# Patient Record
Sex: Female | Born: 1985 | Hispanic: Yes | Marital: Married | Smoking: Never smoker
Health system: Southern US, Community
[De-identification: ages and names within clinical notes are randomized; demographics above are authoritative.]

## PROBLEM LIST (undated history)

## (undated) DIAGNOSIS — O00109 Unspecified tubal pregnancy without intrauterine pregnancy: Secondary | ICD-10-CM

## (undated) DIAGNOSIS — T7840XA Allergy, unspecified, initial encounter: Secondary | ICD-10-CM

## (undated) DIAGNOSIS — N63 Unspecified lump in unspecified breast: Secondary | ICD-10-CM

## (undated) DIAGNOSIS — O039 Complete or unspecified spontaneous abortion without complication: Secondary | ICD-10-CM

## (undated) DIAGNOSIS — D649 Anemia, unspecified: Secondary | ICD-10-CM

## (undated) DIAGNOSIS — Z8759 Personal history of other complications of pregnancy, childbirth and the puerperium: Secondary | ICD-10-CM

## (undated) HISTORY — DX: Personal history of other complications of pregnancy, childbirth and the puerperium: Z87.59

## (undated) HISTORY — DX: Unspecified tubal pregnancy without intrauterine pregnancy: O00.109

## (undated) HISTORY — DX: Allergy, unspecified, initial encounter: T78.40XA

---

## 2013-04-14 ENCOUNTER — Emergency Department: Payer: Self-pay | Admitting: Emergency Medicine

## 2013-04-14 LAB — CBC
HCT: 36.5 % (ref 35.0–47.0)
HGB: 12.4 g/dL (ref 12.0–16.0)
MCH: 31.2 pg (ref 26.0–34.0)
MCHC: 34.1 g/dL (ref 32.0–36.0)
MCV: 92 fL (ref 80–100)
Platelet: 218 10*3/uL (ref 150–440)
RBC: 3.98 10*6/uL (ref 3.80–5.20)
RDW: 12.8 % (ref 11.5–14.5)
WBC: 8 10*3/uL (ref 3.6–11.0)

## 2013-04-14 LAB — URINALYSIS, COMPLETE
BLOOD: NEGATIVE
Bacteria: NONE SEEN
Bilirubin,UR: NEGATIVE
Glucose,UR: NEGATIVE mg/dL (ref 0–75)
Ketone: NEGATIVE
LEUKOCYTE ESTERASE: NEGATIVE
NITRITE: NEGATIVE
PH: 7 (ref 4.5–8.0)
Protein: NEGATIVE
RBC,UR: 1 /HPF (ref 0–5)
Specific Gravity: 1.015 (ref 1.003–1.030)
WBC UR: 1 /HPF (ref 0–5)

## 2013-04-14 LAB — COMPREHENSIVE METABOLIC PANEL
AST: 17 U/L (ref 15–37)
Albumin: 3.5 g/dL (ref 3.4–5.0)
Alkaline Phosphatase: 58 U/L
Anion Gap: 5 — ABNORMAL LOW (ref 7–16)
BUN: 11 mg/dL (ref 7–18)
Bilirubin,Total: 0.2 mg/dL (ref 0.2–1.0)
Calcium, Total: 8.9 mg/dL (ref 8.5–10.1)
Chloride: 105 mmol/L (ref 98–107)
Co2: 26 mmol/L (ref 21–32)
Creatinine: 0.74 mg/dL (ref 0.60–1.30)
EGFR (African American): >60
EGFR (Non-African Amer.): >60
Glucose: 77 mg/dL (ref 65–99)
OSMOLALITY: 270 (ref 275–301)
Potassium: 4 mmol/L (ref 3.5–5.1)
SGPT (ALT): 22 U/L (ref 12–78)
SODIUM: 136 mmol/L (ref 136–145)
Total Protein: 7.3 g/dL (ref 6.4–8.2)

## 2013-04-15 LAB — WET PREP, GENITAL

## 2013-04-15 LAB — GC/CHLAMYDIA PROBE AMP

## 2013-04-15 LAB — HCG, QUANTITATIVE, PREGNANCY: BETA HCG, QUANT.: 27415 m[IU]/mL — AB

## 2013-07-11 ENCOUNTER — Emergency Department: Payer: Self-pay | Admitting: Emergency Medicine

## 2013-07-11 LAB — BASIC METABOLIC PANEL
ANION GAP: 5 — AB (ref 7–16)
BUN: 9 mg/dL (ref 7–18)
Calcium, Total: 9.3 mg/dL (ref 8.5–10.1)
Chloride: 107 mmol/L (ref 98–107)
Co2: 28 mmol/L (ref 21–32)
Creatinine: 0.76 mg/dL (ref 0.60–1.30)
EGFR (Non-African Amer.): >60
Glucose: 102 mg/dL — ABNORMAL HIGH (ref 65–99)
Osmolality: 278 (ref 275–301)
Potassium: 3.9 mmol/L (ref 3.5–5.1)
Sodium: 140 mmol/L (ref 136–145)

## 2015-01-05 ENCOUNTER — Emergency Department
Admission: EM | Admit: 2015-01-05 | Discharge: 2015-01-05 | Disposition: A | Discharge status: Home / Self Care | Payer: Self-pay | Attending: Emergency Medicine | Admitting: Emergency Medicine

## 2015-01-05 DIAGNOSIS — N3289 Other specified disorders of bladder: Secondary | ICD-10-CM | POA: Insufficient documentation

## 2015-01-05 DIAGNOSIS — Z3202 Encounter for pregnancy test, result negative: Secondary | ICD-10-CM | POA: Insufficient documentation

## 2015-01-05 HISTORY — DX: Anemia, unspecified: D64.9

## 2015-01-05 LAB — URINALYSIS COMPLETE WITH MICROSCOPIC (ARMC ONLY)
BILIRUBIN URINE: NEGATIVE
Glucose, UA: NEGATIVE mg/dL
Ketones, ur: NEGATIVE mg/dL
LEUKOCYTES UA: NEGATIVE
Nitrite: NEGATIVE
PH: 7 (ref 5.0–8.0)
Protein, ur: NEGATIVE mg/dL
Specific Gravity, Urine: 1.011 (ref 1.005–1.030)

## 2015-01-05 LAB — POCT PREGNANCY, URINE: PREG TEST UR: NEGATIVE

## 2015-01-05 MED ORDER — PHENAZOPYRIDINE HCL 200 MG PO TABS: 200.0000 mg | ORAL_TABLET | Freq: Three times a day (TID) | ORAL | Status: DC | PRN

## 2015-01-05 NOTE — ED Notes (Signed)
Patient complaining of urinary frequency and lower right and left abdominal pain.

## 2015-01-05 NOTE — ED Provider Notes (Signed)
[facility] Emergency Department Provider Note ____________________________________________  Time seen: Approximately 11:28 PM  I have reviewed the triage vital signs and the nursing notes.   HISTORY  Chief Complaint Urinary Frequency   HPI Breanna Shelton is a 29 y.o. female who presents to the emergency department for evaluation of urinary frequency and pelvic pain. She states that this has been present for about a month. She has not been evaluated for this previously. She reports urinary frequency without dysuria or hematuria.She denies vaginal discharge or bleeding.  Past Medical History  Diagnosis Date  . Anemia     There are no active problems to display for this patient.   History reviewed. No pertinent past surgical history.  Current Outpatient Rx  Name  Route  Sig  Dispense  Refill  . phenazopyridine (PYRIDIUM) 200 MG tablet   Oral   Take 1 tablet (200 mg total) by mouth 3 (three) times daily as needed for pain.   20 tablet   0     Allergies Review of patient's allergies indicates no known allergies.  History reviewed. No pertinent family history.  Social History Social History  Substance Use Topics  . Smoking status: Never Smoker   . Smokeless tobacco: None  . Alcohol Use: No    Review of Systems Constitutional: No fever/chills Cardiovascular: Denies chest pain. Respiratory: Denies shortness of breath or cough. Gastrointestinal: Abdominal pain suprapubic tenderness., nausea no, vomitingno. Genitourinary: Dysuria no, vaginal discharge no.. Musculoskeletal: Negative for back pain. Skin: Negative for rash. Neurological: Negative for headaches, focal weakness or numbness.  10-point ROS otherwise negative.  ____________________________________________   PHYSICAL EXAM:  VITAL SIGNS: ED Triage Vitals  Enc Vitals Group     BP 01/05/15 1848 122/77 mmHg     Pulse Rate 01/05/15 1848 64     Resp 01/05/15 1848 16     Temp  01/05/15 1848 97.4 F (36.3 C)     Temp Source 01/05/15 1848 Oral     SpO2 01/05/15 1848 98 %     Weight 01/05/15 1848 143 lb (64.864 kg)     Height 01/05/15 1848 5' (1.524 m)     Head Cir --      Peak Flow --      Pain Score 01/05/15 1848 5     Pain Loc --      Pain Edu? --      Excl. in GC? --     Constitutional: Alert and oriented. Well appearing and in no acute distress. Eyes: Conjunctivae are normal. PERRL. EOMI. Head: Atraumatic. Nose: No congestion/rhinnorhea. Mouth/Throat: Mucous membranes are moist.  Oropharynx non-erythematous. Neck: No stridor. Cardiovascular: Good peripheral circulation. Respiratory: Normal respiratory effort.  No retractions. Gastrointestinal: Suprapubic tenderness noted on exam. No rebound tenderness, no distention, bowel sounds present 4 quadrants  Genitourinary: Pelvic exam: Deferred Musculoskeletal: No extremity tenderness nor edema.  Neurologic:  Normal speech and language. No gross focal neurologic deficits are appreciated. Speech is normal. No gait instability. Skin:  Skin is warm, dry and intact. No rash noted. Psychiatric: Mood and affect are normal. Speech and behavior are normal.  ____________________________________________   LABS (all labs ordered are listed, but only abnormal results are displayed)  Labs Reviewed  URINALYSIS COMPLETEWITH MICROSCOPIC ([facility] ONLY) - Abnormal; Notable for the following:    Color, Urine STRAW (*)    APPearance CLEAR (*)    Hgb urine dipstick 2+ (*)    Bacteria, UA RARE (*)    Squamous Epithelial / LPF  6-30 (*)    All other components within normal limits  POC URINE PREG, ED  POCT PREGNANCY, URINE   ____________________________________________  RADIOLOGY   ____________________________________________   PROCEDURES  Procedure(s) performed: None  ____________________________________________   INITIAL IMPRESSION / ASSESSMENT AND PLAN / ED COURSE  Pertinent labs & imaging results that  were available during my care of the patient were reviewed by me and considered in my medical decision making (see chart for details).  Patient was instructed to follow up with urology.  She was advised to return to the emergency department for symptoms that change or worsen if she's unable to schedule an appointment. ____________________________________________   FINAL CLINICAL IMPRESSION(S) / ED DIAGNOSES  Final diagnoses:  Bladder spasms       Chinita Pester, FNP 01/05/15 2338  Myrna Blazer, MD 01/05/15 [phone]

## 2015-01-17 ENCOUNTER — Ambulatory Visit (INDEPENDENT_AMBULATORY_CARE_PROVIDER_SITE_OTHER): Payer: Self-pay | Admitting: Urology

## 2015-01-17 ENCOUNTER — Encounter: Payer: Self-pay | Admitting: Urology

## 2015-01-17 VITALS — BP 123/86 | HR 73 | Ht 65.0 in | Wt 142.3 lb

## 2015-01-17 DIAGNOSIS — R350 Frequency of micturition: Secondary | ICD-10-CM

## 2015-01-17 DIAGNOSIS — N393 Stress incontinence (female) (male): Secondary | ICD-10-CM

## 2015-01-17 DIAGNOSIS — R3129 Other microscopic hematuria: Secondary | ICD-10-CM

## 2015-01-17 DIAGNOSIS — R351 Nocturia: Secondary | ICD-10-CM

## 2015-01-17 LAB — BLADDER SCAN AMB NON-IMAGING: SCAN RESULT: 20

## 2015-01-17 LAB — URINALYSIS, COMPLETE
BILIRUBIN UA: NEGATIVE
Glucose, UA: NEGATIVE
KETONES UA: NEGATIVE
LEUKOCYTES UA: NEGATIVE
Nitrite, UA: NEGATIVE
PH UA: 5.5 (ref 5.0–7.5)
PROTEIN UA: NEGATIVE
UUROB: 0.2 mg/dL (ref 0.2–1.0)

## 2015-01-17 LAB — MICROSCOPIC EXAMINATION: WBC, UA: NONE SEEN /hpf (ref 0–?)

## 2015-01-17 NOTE — Progress Notes (Signed)
01/17/2015 1:00 PM   Jacolyn ReedyLessy Katina DungVaquedano 09-26-1985 [id number]  Referring provider: No referring provider defined for this encounter.  Chief Complaint  Patient presents with  . Nocturia  . Urinary Frequency    HPI: Patient is a 29 year old Hispanic female who presents today with the interpreter, Kandis CockingMaritza, for urinary frequency, urgency and nocturia after being [facility] ED for these symptoms.  Her UA at the time of her ED visit was unremarkable.  No imaging studies or cultures were performed.  She states 7 months ago, she started having urinary frequency, urgency and nocturia 4 x 5.  She is not experiencing dysuria or gross hematuria.    She was also experiencing dyspareunia and was evaluated by a gynecology.  She could not remember the name of the gynecologist, but she states it was located near the hospital.  She had a pelvic exam, PAP smear and a vaginal ultrasound.  The results were normal.    She has had constipation for the last 4 months and was seen by a PCP and placed on Miralax.    She is voiding 4 to 5 times during the day and nocturia x 4-5.  She had the recent onset of SUI one month ago.  She does not have a history of recurrent UTI's or stones.  There is not a family history of stones.    Her UA noted 3-10 RBC's/hpf on today's exam and her PVR was 20 mL.    PMH: Past Medical History  Diagnosis Date  . Anemia     Surgical History: No past surgical history on file.  Home Medications:    Medication List    Notice  As of 01/17/2015 11:59 PM   You have not been prescribed any medications.      Allergies: No Known Allergies  Family History: Family History  Problem Relation Age of Onset  . Urolithiasis Paternal Grandmother   . Cancer Paternal Grandmother     uterine cancer  . Kidney cancer Neg Hx   . Kidney disease Neg Hx   . Prostate cancer Neg Hx     Social History:  reports that she has never smoked. She does not have any smokeless tobacco history  on file. She reports that she does not drink alcohol. Her drug history is not on file.  ROS: UROLOGY Frequent Urination?: Yes Hard to postpone urination?: No Burning/pain with urination?: No Get up at night to urinate?: Yes Leakage of urine?: Yes Urine stream starts and stops?: No Trouble starting stream?: No Do you have to strain to urinate?: No Blood in urine?: No Urinary tract infection?: No Sexually transmitted disease?: No Injury to kidneys or bladder?: No Painful intercourse?: Yes Weak stream?: No Currently pregnant?: No Vaginal bleeding?: No Last menstrual period?: 01/10/2015  Gastrointestinal Nausea?: No Vomiting?: No Indigestion/heartburn?: No Diarrhea?: No Constipation?: No  Constitutional Fever: No Night sweats?: No Weight loss?: No Fatigue?: No  Skin Skin rash/lesions?: No Itching?: No  Eyes Blurred vision?: No Double vision?: No  Ears/Nose/Throat Sore throat?: No Sinus problems?: No  Hematologic/Lymphatic Swollen glands?: No Easy bruising?: No  Cardiovascular Leg swelling?: No Chest pain?: No  Respiratory Cough?: No Shortness of breath?: No  Endocrine Excessive thirst?: No  Musculoskeletal Back pain?: No Joint pain?: No  Neurological Headaches?: Yes Dizziness?: No  Psychologic Depression?: No Anxiety?: No  Physical Exam: BP 123/86 mmHg  Pulse 73  Ht 5\' 5"  (1.651 m)  Wt 142 lb 4.8 oz (64.547 kg)  BMI 23.68  kg/m2  LMP 11/29/2014  Constitutional: Well nourished. Alert and oriented, No acute distress. HEENT: [place] AT, moist mucus membranes. Trachea midline, no masses. Cardiovascular: No clubbing, cyanosis, or edema. Respiratory: Normal respiratory effort, no increased work of breathing. GI: Abdomen is soft, non tender, non distended, no abdominal masses. Liver and spleen not palpable.  No hernias appreciated.  Stool sample for occult testing is not indicated.   GU: No CVA tenderness.  No bladder fullness or masses.  Normal  external female genitalia. Urethral meatus is patent. Urethra is without masses, tenderness or scarring.  It is hypermobile.  Bladder is non tender.  Grade I cystocele is noted.  No cervical motion tenderness is noted.  The uterus is freely mobile and non fixed.  No masses in the adnexa or parametria.  Anus and perineum are normal.   Skin: No rashes, bruises or suspicious lesions. Lymph: No cervical or inguinal adenopathy. Neurologic: Grossly intact, no focal deficits, moving all 4 extremities. Psychiatric: Normal mood and affect.  Laboratory Data:  Urinalysis Results for orders placed or performed in visit on 01/17/15  Microscopic Examination  Result Value Ref Range   WBC, UA None seen 0 -  5 /hpf   RBC, UA 3-10 (A) 0 -  2 /hpf   Epithelial Cells (non renal) 0-10 0 - 10 /hpf   Mucus, UA Present (A) Not Estab.   Bacteria, UA Few (A) None seen/Few  Urinalysis, Complete  Result Value Ref Range   Specific Gravity, UA >1.030 (H) 1.005 - 1.030   pH, UA 5.5 5.0 - 7.5   Color, UA Yellow Yellow   Appearance Ur Clear Clear   Leukocytes, UA Negative Negative   Protein, UA Negative Negative/Trace   Glucose, UA Negative Negative   Ketones, UA Negative Negative   RBC, UA Trace (A) Negative   Bilirubin, UA Negative Negative   Urobilinogen, Ur 0.2 0.2 - 1.0 mg/dL   Nitrite, UA Negative Negative   Microscopic Examination See below:   BUN+Creat  Result Value Ref Range   BUN 9 6 - 20 mg/dL   Creatinine, Ser 1.61 0.57 - 1.00 mg/dL   GFR calc non Af Amer 125 >59 mL/min/1.73   GFR calc Af Amer 144 >59 mL/min/1.73   BUN/Creatinine Ratio 15 8 - 20  hCG, serum, qualitative  Result Value Ref Range   hCG,Beta Subunit,Qual,Serum Negative Negative <6 mIU/mL  BLADDER SCAN AMB NON-IMAGING  Result Value Ref Range   Scan Result 20     Pertinent Imaging: Results for LAURINDA, CARRENO (MRN [id number]) as of 01/18/2015 12:33  Ref. Range 01/17/2015 14:23  Scan Result Unknown 20    Assessment & Plan:      1. Microscopic hematuria:   Explained to patient the causes of blood in the urine are as follows: stones, BPH, UTI's, damage to the urinary tract and/or cancer.  It is explained to the patient that they will be scheduled for a CT Urogram with contrast material and that in rare instances, an allergic reaction can be serious and even life threatening with the injection of contrast material.   The patient denies any allergies to contrast, iodine and/or seafood and is not taking metformin.  2. Urinary frequency:   Patient's UA demonstrated microscopic hematuria and her PVR was minimal.  Her frequency may be the result of a ureteral stone.  She will be undergoing a CT Urogram for further evaluation.    - CULTURE, URINE COMPREHENSIVE - Urinalysis, Complete - BLADDER SCAN AMB NON-IMAGING  3. Nocturia:   See above.  4. SUI:   We will address once the hematuria work up is completed.     Return for CT Urogram report.  Michiel Cowboy, PA-C  [facility] [address], [address] [place], [place] [address] [phone]

## 2015-01-18 DIAGNOSIS — N393 Stress incontinence (female) (male): Secondary | ICD-10-CM | POA: Insufficient documentation

## 2015-01-18 DIAGNOSIS — R350 Frequency of micturition: Secondary | ICD-10-CM | POA: Insufficient documentation

## 2015-01-18 DIAGNOSIS — R351 Nocturia: Secondary | ICD-10-CM | POA: Insufficient documentation

## 2015-01-18 DIAGNOSIS — R3129 Other microscopic hematuria: Secondary | ICD-10-CM | POA: Insufficient documentation

## 2015-01-18 LAB — BUN+CREAT
BUN/Creatinine Ratio: 15 (ref 8–20)
BUN: 9 mg/dL (ref 6–20)
CREATININE: 0.6 mg/dL (ref 0.57–1.00)
GFR, EST AFRICAN AMERICAN: 144 mL/min/{1.73_m2} (ref >59)
GFR, EST NON AFRICAN AMERICAN: 125 mL/min/{1.73_m2} (ref >59)

## 2015-01-18 LAB — HCG, SERUM, QUALITATIVE: hCG,Beta Subunit,Qual,Serum: NEGATIVE m[IU]/mL (ref <6)

## 2015-01-21 LAB — CULTURE, URINE COMPREHENSIVE

## 2015-01-25 ENCOUNTER — Ambulatory Visit
Admission: RE | Admit: 2015-01-25 | Discharge: 2015-01-25 | Disposition: A | Discharge status: Home / Self Care | Payer: Self-pay | Source: Ambulatory Visit | Attending: Urology | Admitting: Urology

## 2015-01-25 DIAGNOSIS — R3129 Other microscopic hematuria: Secondary | ICD-10-CM

## 2015-01-25 MED ORDER — IOHEXOL 350 MG/ML SOLN
125.0000 mL | Freq: Once | INTRAVENOUS | Status: AC | PRN
  Administered 2015-01-25: 125 mL via INTRAVENOUS

## 2015-01-31 ENCOUNTER — Encounter: Payer: Self-pay | Admitting: Urology

## 2015-01-31 ENCOUNTER — Ambulatory Visit: Payer: Self-pay | Admitting: Urology

## 2015-01-31 LAB — URINALYSIS, COMPLETE
BILIRUBIN UA: NEGATIVE
GLUCOSE, UA: NEGATIVE
KETONES UA: NEGATIVE
LEUKOCYTES UA: NEGATIVE
Nitrite, UA: NEGATIVE
PROTEIN UA: NEGATIVE
SPEC GRAV UA: 1.02 (ref 1.005–1.030)
Urobilinogen, Ur: 0.2 mg/dL (ref 0.2–1.0)
pH, UA: 6.5 (ref 5.0–7.5)

## 2015-01-31 LAB — MICROSCOPIC EXAMINATION
Bacteria, UA: NONE SEEN
EPITHELIAL CELLS (NON RENAL): NONE SEEN /HPF (ref 0–10)

## 2015-01-31 MED ORDER — CIPROFLOXACIN HCL 500 MG PO TABS: 500.0000 mg | ORAL_TABLET | Freq: Once | ORAL | Status: DC

## 2015-01-31 MED ORDER — LIDOCAINE HCL 2 % EX GEL: 1.0000 "application " | Freq: Once | CUTANEOUS | Status: DC

## 2015-01-31 NOTE — Progress Notes (Signed)
The pt No Show today for her Cysto visit.

## 2015-01-31 NOTE — Progress Notes (Signed)
This encounter was created in error - please disregard.

## 2015-02-10 ENCOUNTER — Encounter: Payer: Self-pay | Admitting: Urology

## 2015-02-10 ENCOUNTER — Ambulatory Visit (INDEPENDENT_AMBULATORY_CARE_PROVIDER_SITE_OTHER): Payer: Self-pay | Admitting: Urology

## 2015-02-10 VITALS — BP 115/79 | HR 74 | Ht 62.0 in | Wt 146.6 lb

## 2015-02-10 DIAGNOSIS — R3129 Other microscopic hematuria: Secondary | ICD-10-CM

## 2015-02-10 LAB — URINALYSIS, COMPLETE
Bilirubin, UA: NEGATIVE
GLUCOSE, UA: NEGATIVE
KETONES UA: NEGATIVE
Nitrite, UA: NEGATIVE
PROTEIN UA: NEGATIVE
SPEC GRAV UA: 1.02 (ref 1.005–1.030)
Urobilinogen, Ur: 0.2 mg/dL (ref 0.2–1.0)
pH, UA: 6 (ref 5.0–7.5)

## 2015-02-10 LAB — MICROSCOPIC EXAMINATION: Epithelial Cells (non renal): >10 /hpf — ABNORMAL HIGH (ref 0–10)

## 2015-02-10 MED ORDER — CIPROFLOXACIN HCL 500 MG PO TABS
500.0000 mg | ORAL_TABLET | Freq: Once | ORAL | Status: AC
  Administered 2015-02-10: 500 mg via ORAL

## 2015-02-10 MED ORDER — LIDOCAINE HCL 2 % EX GEL
1.0000 "application " | Freq: Once | CUTANEOUS | Status: AC
  Administered 2015-02-10: 1 via URETHRAL

## 2015-02-10 NOTE — Progress Notes (Signed)
   HPI: [name] is a 29yo with a hx of microhematuria here for cystoscopy for her hematuria workup. CT abd/pelvis w/wo contrast did not show any GU abnormalities Cystoscopy Procedure Note  Patient identification was confirmed, informed consent was obtained, and patient was prepped using Betadine solution.  Lidocaine jelly was administered per urethral meatus.    Preoperative abx where received prior to procedure.    Procedure: - Flexible cystoscope introduced, without any difficulty.   - Thorough search of the bladder revealed:    normal urethral meatus    normal urothelium    no stones    no ulcers     no tumors    no urethral polyps    no trabeculation  - Ureteral orifices were normal in position and appearance.  Post-Procedure: - Patient tolerated the procedure well  Assessment: Microhematuria  Plan: 1. RTC 3 months with UA

## 2015-03-30 ENCOUNTER — Emergency Department
Admission: EM | Admit: 2015-03-30 | Discharge: 2015-03-30 | Disposition: A | Discharge status: Home / Self Care | Payer: Self-pay | Attending: Emergency Medicine | Admitting: Emergency Medicine

## 2015-03-30 ENCOUNTER — Encounter: Payer: Self-pay | Admitting: *Deleted

## 2015-03-30 DIAGNOSIS — R102 Pelvic and perineal pain: Secondary | ICD-10-CM

## 2015-03-30 DIAGNOSIS — B9689 Other specified bacterial agents as the cause of diseases classified elsewhere: Secondary | ICD-10-CM

## 2015-03-30 DIAGNOSIS — N760 Acute vaginitis: Secondary | ICD-10-CM | POA: Insufficient documentation

## 2015-03-30 DIAGNOSIS — Z3202 Encounter for pregnancy test, result negative: Secondary | ICD-10-CM | POA: Insufficient documentation

## 2015-03-30 DIAGNOSIS — N926 Irregular menstruation, unspecified: Secondary | ICD-10-CM | POA: Insufficient documentation

## 2015-03-30 LAB — WET PREP, GENITAL
Sperm: NONE SEEN
Trich, Wet Prep: NONE SEEN
Yeast Wet Prep HPF POC: NONE SEEN

## 2015-03-30 LAB — URINALYSIS COMPLETE WITH MICROSCOPIC (ARMC ONLY)
Bilirubin Urine: NEGATIVE
Glucose, UA: NEGATIVE mg/dL
Hgb urine dipstick: NEGATIVE
KETONES UR: NEGATIVE mg/dL
Leukocytes, UA: NEGATIVE
Nitrite: NEGATIVE
PROTEIN: NEGATIVE mg/dL
Specific Gravity, Urine: 1.016 (ref 1.005–1.030)
pH: 7 (ref 5.0–8.0)

## 2015-03-30 LAB — COMPREHENSIVE METABOLIC PANEL
ALT: 17 U/L (ref 14–54)
AST: 20 U/L (ref 15–41)
Albumin: 4.5 g/dL (ref 3.5–5.0)
Alkaline Phosphatase: 76 U/L (ref 38–126)
Anion gap: 8 (ref 5–15)
BUN: 13 mg/dL (ref 6–20)
CHLORIDE: 103 mmol/L (ref 101–111)
CO2: 25 mmol/L (ref 22–32)
CREATININE: 0.65 mg/dL (ref 0.44–1.00)
Calcium: 9.6 mg/dL (ref 8.9–10.3)
GFR calc Af Amer: >60 mL/min (ref >60)
GFR calc non Af Amer: >60 mL/min (ref >60)
GLUCOSE: 93 mg/dL (ref 65–99)
Potassium: 4.1 mmol/L (ref 3.5–5.1)
SODIUM: 136 mmol/L (ref 135–145)
Total Bilirubin: 0.3 mg/dL (ref 0.3–1.2)
Total Protein: 7.8 g/dL (ref 6.5–8.1)

## 2015-03-30 LAB — CHLAMYDIA/NGC RT PCR (ARMC ONLY)
CHLAMYDIA TR: NOT DETECTED
N GONORRHOEAE: NOT DETECTED

## 2015-03-30 LAB — CBC
HCT: 41.2 % (ref 35.0–47.0)
Hemoglobin: 13.9 g/dL (ref 12.0–16.0)
MCH: 30.6 pg (ref 26.0–34.0)
MCHC: 33.8 g/dL (ref 32.0–36.0)
MCV: 90.7 fL (ref 80.0–100.0)
PLATELETS: 253 10*3/uL (ref 150–440)
RBC: 4.55 MIL/uL (ref 3.80–5.20)
RDW: 12.6 % (ref 11.5–14.5)
WBC: 6.5 10*3/uL (ref 3.6–11.0)

## 2015-03-30 LAB — LIPASE, BLOOD: LIPASE: 24 U/L (ref 11–51)

## 2015-03-30 LAB — POCT PREGNANCY, URINE: Preg Test, Ur: NEGATIVE

## 2015-03-30 MED ORDER — PROMETHAZINE HCL 25 MG PO TABS: 25.0000 mg | ORAL_TABLET | Freq: Four times a day (QID) | ORAL | Status: DC | PRN

## 2015-03-30 MED ORDER — MEDROXYPROGESTERONE ACETATE 10 MG PO TABS: 10.0000 mg | ORAL_TABLET | Freq: Every day | ORAL | Status: DC

## 2015-03-30 MED ORDER — METRONIDAZOLE 500 MG PO TABS: 500.0000 mg | ORAL_TABLET | Freq: Two times a day (BID) | ORAL | Status: DC

## 2015-03-30 NOTE — ED Provider Notes (Signed)
[facility] Emergency Department Provider Note  ____________________________________________  Time seen: 7:15 PM  I have reviewed the triage vital signs and the nursing notes.   HISTORY  Chief Complaint Abdominal Pain  history obtained with Spanish interpreter at bedside   HPI [facility] Katina Dung is a 30 y.o. female who complains of lower abdominal pain for the past week that worsened over the last 2 days. No nausea vomiting diarrhea. She denies any dysuria frequency or urgency. Has noticed some white vaginal discharge. Has very low suspicion of sexual transmitted infection as she is in a monogamous relationship with her husband. She also reports that she has not had a period since her last cycle which was 02/10/2015. She does note that she has used multiple different contraceptives over the past year including Depo-Provera, , New ring, oral contraceptive pills. Most recently she has been receiving a intramuscular hormonal injection in [place] that she was getting once a month. She last had that in November 2016.     Past Medical History  Diagnosis Date  . Anemia      Patient Active Problem List   Diagnosis Date Noted  . Urinary frequency 01/18/2015  . Microscopic hematuria 01/18/2015  . Nocturia 01/18/2015  . SUI (stress urinary incontinence, female) 01/18/2015     No past surgical history on file.   Current Outpatient Rx  Name  Route  Sig  Dispense  Refill  . medroxyPROGESTERone (PROVERA) 10 MG tablet   Oral   Take 1 tablet (10 mg total) by mouth daily.   7 tablet   0   . metroNIDAZOLE (FLAGYL) 500 MG tablet   Oral   Take 1 tablet (500 mg total) by mouth 2 (two) times daily.   14 tablet   0   . promethazine (PHENERGAN) 25 MG tablet   Oral   Take 1 tablet (25 mg total) by mouth every 6 (six) hours as needed for nausea or vomiting.   15 tablet   0      Allergies Review of patient's allergies indicates no known  allergies.   Family History  Problem Relation Age of Onset  . Urolithiasis Paternal Grandmother   . Cancer Paternal Grandmother     uterine cancer  . Kidney cancer Neg Hx   . Kidney disease Neg Hx   . Prostate cancer Neg Hx     Social History Social History  Substance Use Topics  . Smoking status: Never Smoker   . Smokeless tobacco: None  . Alcohol Use: No    Review of Systems  Constitutional:   No fever positive chills. No weight changes Eyes:   No blurry vision or double vision.  ENT:   No sore throat. Cardiovascular:   No chest pain. Respiratory:   No dyspnea or cough. Gastrointestinal:   Positive pelvic pain without vomiting or diarrhea.  No BRBPR or melena. Genitourinary:   Negative for dysuria, urinary retention, bloody urine, or difficulty urinating. Musculoskeletal:   Negative for back pain. No joint swelling or pain. Skin:   Negative for rash. Neurological:   Negative for headaches, focal weakness or numbness. Psychiatric:  No anxiety or depression.   Endocrine:  No hot/cold intolerance, changes in energy, or sleep difficulty.  10-point ROS otherwise negative.  ____________________________________________   PHYSICAL EXAM:  VITAL SIGNS: ED Triage Vitals  Enc Vitals Group     BP 03/30/15 1815 115/69 mmHg     Pulse Rate 03/30/15 1815 60     Resp 03/30/15  1815 18     Temp 03/30/15 1815 98.6 F (37 C)     Temp Source 03/30/15 1815 Oral     SpO2 03/30/15 1815 98 %     Weight 03/30/15 1815 145 lb (65.772 kg)     Height 03/30/15 1815  (1.549 m)     Head Cir --      Peak Flow --      Pain Score 03/30/15 1816 6     Pain Loc --      Pain Edu? --      Excl. in GC? --     Vital signs reviewed, nursing assessments reviewed.   Constitutional:   Alert and oriented. Well appearing and in no distress. Eyes:   No scleral icterus. No conjunctival pallor. PERRL. EOMI ENT   Head:   Normocephalic and atraumatic.   Nose:   No congestion/rhinnorhea.  No septal hematoma   Mouth/Throat:   MMM, no pharyngeal erythema. No peritonsillar mass. No uvula shift.   Neck:   No stridor. No SubQ emphysema. No meningismus. Hematological/Lymphatic/Immunilogical:   No cervical lymphadenopathy. Cardiovascular:   RRR. Normal and symmetric distal pulses are present in all extremities. No murmurs, rubs, or gallops. Respiratory:   Normal respiratory effort without tachypnea nor retractions. Breath sounds are clear and equal bilaterally. No wheezes/rales/rhonchi. Gastrointestinal:   Soft and nontender. No distention. There is no CVA tenderness.  No rebound, rigidity, or guarding. Genitourinary:   Examination performed with the nurse. External exam normal. Speculum exam reveals take chunky white discharge in the vault. No bleeding. No lesions or areas of inflammation. Bimanual exam reveals some diffuse tenderness. No cervical motion tenderness. No adnexal tenderness or masses. Uterus is nontender on bimanual exam. Musculoskeletal:   Nontender with normal range of motion in all extremities. No joint effusions.  No lower extremity tenderness.  No edema. Neurologic:   Normal speech and language.  CN 2-10 normal. Motor grossly intact. No pronator drift.  Normal gait. No gross focal neurologic deficits are appreciated.  Skin:    Skin is warm, dry and intact. No rash noted.  No petechiae, purpura, or bullae. Psychiatric:   Mood and affect are normal. Speech and behavior are normal. Patient exhibits appropriate insight and judgment.  ____________________________________________    LABS (pertinent positives/negatives) (all labs ordered are listed, but only abnormal results are displayed) Labs Reviewed  WET PREP, GENITAL - Abnormal; Notable for the following:    Clue Cells Wet Prep HPF POC PRESENT (*)    WBC, Wet Prep HPF POC FEW (*)    All other components within normal limits  URINALYSIS COMPLETEWITH MICROSCOPIC ([facility] ONLY) - Abnormal; Notable for the  following:    Color, Urine YELLOW (*)    APPearance CLEAR (*)    Bacteria, UA RARE (*)    Squamous Epithelial / LPF 0-5 (*)    All other components within normal limits  CHLAMYDIA/NGC RT PCR ([facility] ONLY)  LIPASE, BLOOD  COMPREHENSIVE METABOLIC PANEL  CBC  POC URINE PREG, ED  POCT PREGNANCY, URINE   ____________________________________________   EKG    ____________________________________________    RADIOLOGY    ____________________________________________   PROCEDURES   ____________________________________________   INITIAL IMPRESSION / ASSESSMENT AND PLAN / ED COURSE  Pertinent labs & imaging results that were available during my care of the patient were reviewed by me and considered in my medical decision making (see chart for details).  Patient presents with pelvic pain and discharge and a missed period. There  appear to be 2 unrelated issues. The first is that she is having pelvic pain with discharge which appears to be bacterial vaginosis. Very low suspicion for STI PID TOA. Low suspicion for cyst or torsion. We'll treat with Flagyl and Phenergan as needed for associated nausea.  The second issue is her missed period. Pregnancy test is negative and she has no other symptoms to suggest early pregnancy such as morning sickness or breast tenderness or enlargement. She has used multiple hormonal contraceptives and I wonder if this is disrupted her hormonal regulation and caused a degree of secondary amenorrhea. Give her a course of Provera to attempt to restart this. She also notes that she intends to start using a NuvaRing which should also help regulate her cycles. If she has persistent problems with this she can follow up with obstetrics. I have a low suspicion of pregnancy or ectopic at this point.     ____________________________________________   FINAL CLINICAL IMPRESSION(S) / ED DIAGNOSES  Final diagnoses:  Pelvic pain in female  Missed period   Bacterial vaginosis      Sharman Cheek, MD 03/30/15 2045

## 2015-03-30 NOTE — Discharge Instructions (Signed)
Dolor abdominal en adultos (Abdominal Pain, Adult) El dolor puede tener muchas causas. Normalmente la causa del dolor abdominal no es una enfermedad y Scientist, clinical (histocompatibility and immunogenetics) sin TEFL teacher. Frecuentemente puede controlarse y tratarse en casa. Su mdico le Medical sales representative examen fsico y posiblemente solicite anlisis de sangre y radiografas para ayudar a Chief Strategy Officer la gravedad de su dolor. Sin embargo, en [employer], debe transcurrir ms tiempo antes de que se pueda Clinical research associate una causa evidente del dolor. Antes de llegar a ese punto, es posible que su mdico no sepa si necesita ms pruebas o un tratamiento ms profundo. INSTRUCCIONES PARA EL CUIDADO EN EL HOGAR  Est atento al dolor para ver si hay cambios. Las siguientes indicaciones ayudarn a Architectural technologist que pueda sentir:  [place] solo medicamentos de venta libre o recetados, segn las indicaciones del mdico.  No tome laxantes a menos que se lo haya indicado su mdico.  Pruebe con Neomia Dear dieta lquida absoluta (caldo, t o agua) segn se lo indique su mdico. Introduzca gradualmente una dieta normal, segn su tolerancia. SOLICITE ATENCIN MDICA SI:  Tiene dolor abdominal sin explicacin.  Tiene dolor abdominal relacionado con nuseas o diarrea.  Tiene dolor cuando orina o defeca.  Experimenta dolor abdominal que lo despierta de noche.  Tiene dolor abdominal que empeora o mejora cuando come alimentos.  Tiene dolor abdominal que empeora cuando come alimentos grasosos.  Tiene fiebre. SOLICITE ATENCIN MDICA DE INMEDIATO SI:   El dolor no desaparece en un plazo mximo de 2horas.  No deja de (vomitar).  El Engineer, mining se siente solo en partes del abdomen, como el lado derecho o la parte inferior izquierda del abdomen.  Evaca materia fecal sanguinolenta o negra, de aspecto alquitranado. ASEGRESE DE QUE:  Comprende estas instrucciones.  Controlar su afeccin.  Recibir ayuda de inmediato si no mejora o si empeora.   Esta  informacin no tiene Theme park manager el consejo del mdico. Asegrese de hacerle al mdico cualquier pregunta que tenga.   Document Released: 02/19/2005 Document Revised: 03/12/2014 Elsevier Interactive Patient Education 2016 [employer].  Antibiticos (Antibiotic Medicine) Los antibiticos se utilizan para tratar las infecciones causadas por bacterias. Estos medicamentos daan o matan a los grmenes que producen enfermedades. CMO SE ELEGIR EL MEDICAMENTO? Hay muchos tipos de antibiticos. Para ayudar al mdico a escoger uno, infrmele lo siguiente:  Si tiene [place].  Si est embarazada o planea estarlo.  Si est amamantando.  Si est tomando algn medicamento. Por ejemplo, medicamentos recetados y de [address], as como remedios a base de hierbas.  Si tiene una enfermedad o un problema. Si tiene dudas sobre por qu se eligi el medicamento, pregunte. CUNTO TIEMPO DEBO TOMAR EL MEDICAMENTO? Tome el medicamento durante el tiempo que le indique el mdico. Aunque se sienta mejor, no deje de tomarlo. Si deja de tomarlo demasiado pronto:  Puede empezar a sentirse mal nuevamente.  Puede ser ms difcil tratar la infeccin.  Pueden aparecer nuevos problemas. QU SUCEDE SI OMITO UNA DOSIS? Intente no omitir ninguna dosis del antibitico. Si omite una dosis:  Tmela tan pronto como pueda.  Si est tomando 2dosis diarias, tome la siguiente dosis a las 5 o [address]  Si est tomando 3o ms dosis diarias, tome la siguiente dosis a las 2 o 4horas. Luego tome las dosis en los horarios normales. Si no puede tomar una dosis que se salte, tome la siguiente dosis en el horario correspondiente. A continuacin, tome la dosis que se salte despus de haber tomado todas las  dosis como se lo haya indicado el mdico, como si le faltara tomar una dosis ms. ESTE MEDICAMENTO AFECTA LA ANTICONCEPCIN? Es posible que los anticonceptivos no resulten eficaces mientras se toman  antibiticos. Si est tomando pldoras anticonceptivas, contine tomndolas como siempre. Use un segundo mtodo anticonceptivo, por ejemplo, un preservativo. Siga utilizando el segundo mtodo anticonceptivo hasta terminar el ciclo actual de de pldoras anticonceptivas. SOLICITE AYUDA SI:  Empeora.  No se siente mejor despus de [address] de [address] empezado a Designer, industrial/product.  Vomita.  Le aparecen placas blancas en la boca.  Tiene un dolor articular nuevo despus de empezar a Designer, industrial/product.  Tiene dolores musculares nuevos despus de Corporate investment banker a Designer, industrial/product.  Tuvo fiebre antes de Corporate investment banker a Designer, industrial/product, y este sntoma regresa.  Tiene sntomas de Runner, broadcasting/film/video, como una erupcin cutnea que le produce picazn. Si esto ocurre, deje de Designer, industrial/product. SOLICITE AYUDA DE INMEDIATO SI:  La orina se vuelve oscura o de color sangre.  La piel se vuelve amarillenta.  Le salen hematomas o sangra fcilmente.  Tiene diarrea muy intensa y clicos abdominales.  Siente un dolor de cabeza muy intenso.  Tiene signos de Runner, broadcasting/film/video muy grave, por ejemplo:  Problemas respiratorios.  Sibilancias.  Hinchazn de los labios, la lengua o el rostro.  Desmayos.  Ampollas en la piel o en la boca. Si tiene signos de Runner, broadcasting/film/video muy grave, deje de tomar el antibitico de inmediato.   Esta informacin no tiene Theme park manager el consejo del mdico. Asegrese de hacerle al mdico cualquier pregunta que tenga.   Document Released: 03/24/2010 Document Revised: 11/10/2014 Elsevier Interactive Patient Education 2016 [employer].  Vaginosis bacteriana (Bacterial Vaginosis) La vaginosis bacteriana es una infeccin vaginal que perturba el equilibrio normal de las bacterias que se encuentran en la vagina. Es el resultado de un crecimiento excesivo de ciertas bacterias. Esta es la infeccin vaginal ms frecuente en mujeres en edad  reproductiva. El tratamiento es importante para prevenir complicaciones, especialmente en mujeres embarazadas, dado que puede causar un parto prematuro. CAUSAS  La vaginosis bacteriana se origina por un aumento de bacterias nocivas que, generalmente, estn presentes en cantidades ms pequeas en la vagina. Varios tipos diferentes de bacterias pueden causar esta afeccin. Sin embargo, la causa de su desarrollo no se comprende totalmente. FACTORES DE RIESGO Ciertas actividades o comportamientos pueden exponerlo a un mayor riesgo de desarrollar vaginosis bacteriana, entre los que se incluyen:  Tener una nueva pareja sexual o mltiples parejas sexuales.  Las duchas vaginales  El uso del DIU (dispositivo intrauterino) como mtodo anticonceptivo. El contagio no se produce en baos, por ropas de cama, en piscinas o por contacto con objetos. SIGNOS Y SNTOMAS  Algunas mujeres que padecen vaginosis bacteriana no presentan signos ni sntomas. Los sntomas ms comunes son:  Secrecin vaginal de color grisceo.  Secrecin vaginal con olor similar al [employer], especialmente despus de Sales promotion account executive.  Picazn o sensacin de ardor en la vagina o la vulva.  Ardor o dolor al [employer]. DIAGNSTICO  Su mdico analizar su historia clnica y le examinar la vagina para detectar signos de vaginosis bacteriana. Puede tomarle Lauris Poag de flujo vaginal. Su mdico examinar esta muestra con un microscopio para controlar las bacterias y clulas anormales. Tambin puede realizarse un anlisis del pH vaginal.  TRATAMIENTO  La vaginosis bacteriana puede tratarse con antibiticos, en forma de comprimidos o de crema vaginal. Puede indicarse una segunda tanda  de antibiticos si la afeccin se repite despus del tratamiento. Debido a que la vaginosis bacteriana aumenta el riesgo de contraer enfermedades de transmisin sexual, el tratamiento puede ayudar a reducir el riesgo de clamidia, [place], VIH y  herpes. INSTRUCCIONES PARA EL CUIDADO EN EL HOGAR   Tome solo medicamentos de venta libre o recetados, segn las indicaciones del mdico.  Si le han recetado antibiticos, tmelos como se le indic. Asegrese de que finaliza la prescripcin completa aunque se sienta mejor.  Comunique a sus compaeros sexuales que sufre una infeccin vaginal. Deben consultar a su mdico y recibir tratamiento si tienen problemas, como picazn o una erupcin cutnea leve.  Durante el [place], es importante que siga estas indicaciones:  Fish farm manager relaciones sexuales o use preservativos de la forma correcta.  No se haga duchas vaginales.  Evite consumir alcohol como se lo haya indicado el mdico.  Psychologist, sport and exercise se lo haya indicado el mdico. SOLICITE ATENCIN MDICA SI:   Sus sntomas no mejoran despus de 3 das de [place].  Aumenta la secrecin o Chief Technology Officer.  Tiene fiebre. ASEGRESE DE QUE:   Comprende estas instrucciones.  Controlar su afeccin.  Recibir ayuda de inmediato si no mejora o si empeora. PARA OBTENER MS INFORMACIN  Centros para el control y la prevencin de Child psychotherapist for Disease Control and Prevention, CDC): SolutionApps.co.za Asociacin Estadounidense de la Salud Sexual (American Sexual Health Association, SHA): www.ashastd.org    Esta informacin no tiene Theme park manager el consejo del mdico. Asegrese de hacerle al mdico cualquier pregunta que tenga.   Document Released: 05/29/2007 Document Revised: 03/12/2014 Elsevier Interactive Patient Education [employer]

## 2015-03-30 NOTE — ED Notes (Signed)
Pt has low abd pain for 1 week.  No vag bleeding.  No dysuria.  No back pain.

## 2015-03-30 NOTE — ED Notes (Signed)
Patient complaining of lower abdominal and flank pain and chills for a week, increasing over the past two days. Denies N/V/D, denies pain with urination. States she is having normal bowel movements. A&O x4. Patient in no apparent distress.

## 2015-05-12 ENCOUNTER — Encounter: Payer: Self-pay | Admitting: Urology

## 2015-05-12 ENCOUNTER — Ambulatory Visit (INDEPENDENT_AMBULATORY_CARE_PROVIDER_SITE_OTHER): Payer: Self-pay | Admitting: Urology

## 2015-05-12 VITALS — BP 114/75 | HR 72 | Ht 61.0 in | Wt 146.2 lb

## 2015-05-12 DIAGNOSIS — N3281 Overactive bladder: Secondary | ICD-10-CM

## 2015-05-12 DIAGNOSIS — R3129 Other microscopic hematuria: Secondary | ICD-10-CM

## 2015-05-12 LAB — URINALYSIS, COMPLETE
Bilirubin, UA: NEGATIVE
GLUCOSE, UA: NEGATIVE
Ketones, UA: NEGATIVE
LEUKOCYTES UA: NEGATIVE
Nitrite, UA: NEGATIVE
PROTEIN UA: NEGATIVE
RBC, UA: NEGATIVE
Specific Gravity, UA: >1.03 — ABNORMAL HIGH (ref 1.005–1.030)
UUROB: 0.2 mg/dL (ref 0.2–1.0)
pH, UA: 5.5 (ref 5.0–7.5)

## 2015-05-12 LAB — MICROSCOPIC EXAMINATION

## 2015-05-12 MED ORDER — MIRABEGRON ER 25 MG PO TB24: 25.0000 mg | ORAL_TABLET | Freq: Every day | ORAL | Status: DC

## 2015-05-12 NOTE — Progress Notes (Signed)
05/12/2015 3:35 PM   Breanna Shelton 1985/10/11 [id number]  Referring provider: No referring provider defined for this encounter.  Chief Complaint  Patient presents with  . Follow-up    hematuria    HPI: Patient is a 30 year old Hispanic female who presents today with the interpreter, Kandis Cocking, for urinary frequency, urgency and nocturia after being seen in [facility] ED for these symptoms. Her UA at the time of her ED visit was unremarkable. No imaging studies or cultures were performed.  She states 7 months ago, she started having urinary frequency, urgency and nocturia 4 x 5. She is not experiencing dysuria or gross hematuria.   She was also experiencing dyspareunia and was evaluated by a gynecology. She could not remember the name of the gynecologist, but she states it was located near the hospital. She had a pelvic exam, PAP smear and a vaginal ultrasound. The results were normal.   She has had constipation for the last 4 months and was seen by a PCP and placed on Miralax.   She is voiding 4 to 5 times during the day and nocturia x 4-5. She had the recent onset of SUI one month ago. She does not have a history of recurrent UTI's or stones. There is not a family history of stones.   Her UA noted 3-10 RBC's/hpf on today's exam and her PVR was 20 mL.   PE: Normal external female genitalia. Urethral meatus is patent. Urethra is without masses, tenderness or scarring. It is hypermobile. Bladder is non tender. Grade I cystocele is noted. No cervical motion tenderness is   March 2017 interval history: The patient underwent a hematuria workup which was negative in December 2016. The patient notes continued urinary frequency as well as significant urinary urgency. She also has nocturia 4-5 times per night but she only voids a small amount during each void. She finds is very bothersome to her quality of life. She does note that during which excises she's noticed small  amount of urine leaks but does not find this bothersome.  PMH: Past Medical History  Diagnosis Date  . Anemia     Surgical History: No past surgical history on file.  Home Medications:    Medication List       This list is accurate as of: 05/12/15  3:35 PM.  Always use your most recent med list.               medroxyPROGESTERone 10 MG tablet  Commonly known as:  PROVERA  Take 1 tablet (10 mg total) by mouth daily.        Allergies: No Known Allergies  Family History: Family History  Problem Relation Age of Onset  . Urolithiasis Paternal Grandmother   . Cancer Paternal Grandmother     uterine cancer  . Kidney cancer Neg Hx   . Kidney disease Neg Hx   . Prostate cancer Neg Hx     Social History:  reports that she has never smoked. She does not have any smokeless tobacco history on file. She reports that she does not drink alcohol. Her drug history is not on file.  ROS: UROLOGY Frequent Urination?: Yes Hard to postpone urination?: Yes Burning/pain with urination?: No Get up at night to urinate?: Yes Leakage of urine?: Yes Urine stream starts and stops?: No Trouble starting stream?: No Do you have to strain to urinate?: No Blood in urine?: No Urinary tract infection?: Yes Sexually transmitted disease?: No Injury to kidneys or  bladder?: No Painful intercourse?: Yes Weak stream?: Yes Currently pregnant?: No Vaginal bleeding?: No Last menstrual period?: 04/27/15  Gastrointestinal Nausea?: No Vomiting?: No Indigestion/heartburn?: Yes Diarrhea?: No Constipation?: Yes  Constitutional Fever: No Night sweats?: No Weight loss?: No Fatigue?: No  Skin Skin rash/lesions?: No Itching?: No  Eyes Blurred vision?: Yes Double vision?: No  Ears/Nose/Throat Sore throat?: No Sinus problems?: No  Hematologic/Lymphatic Swollen glands?: No Easy bruising?: Yes  Cardiovascular Leg swelling?: No Chest pain?: No  Respiratory Cough?: No Shortness of  breath?: No  Endocrine Excessive thirst?: No  Musculoskeletal Back pain?: No Joint pain?: No  Neurological Headaches?: Yes Dizziness?: No  Psychologic Depression?: No Anxiety?: No  Physical Exam: BP 114/75 mmHg  Pulse 72  Ht 5\' 1"  (1.549 m)  Wt 146 lb 3.2 oz (66.316 kg)  BMI 27.64 kg/m2  LMP 04/27/2015  Constitutional:  Alert and oriented, No acute distress. HEENT: [place] AT, moist mucus membranes.  Trachea midline, no masses. Cardiovascular: No clubbing, cyanosis, or edema. Respiratory: Normal respiratory effort, no increased work of breathing. GI: Abdomen is soft, nontender, nondistended, no abdominal masses GU: No CVA tenderness.  Skin: No rashes, bruises or suspicious lesions. Lymph: No cervical or inguinal adenopathy. Neurologic: Grossly intact, no focal deficits, moving all 4 extremities. Psychiatric: Normal mood and affect.  Laboratory Data: Lab Results  Component Value Date   WBC 6.5 03/30/2015   HGB 13.9 03/30/2015   HCT 41.2 03/30/2015   MCV 90.7 03/30/2015   PLT 253 03/30/2015    Lab Results  Component Value Date   CREATININE 0.65 03/30/2015    No results found for: PSA  No results found for: TESTOSTERONE  No results found for: HGBA1C  Urinalysis    Component Value Date/Time   COLORURINE YELLOW* 03/30/2015 1821   APPEARANCEUR CLEAR* 03/30/2015 1821   LABSPEC 1.016 03/30/2015 1821   PHURINE 7.0 03/30/2015 1821   GLUCOSEU NEGATIVE 03/30/2015 1821   HGBUR NEGATIVE 03/30/2015 1821   BILIRUBINUR NEGATIVE 03/30/2015 1821   BILIRUBINUR Negative 02/10/2015 0938   KETONESUR NEGATIVE 03/30/2015 1821   PROTEINUR NEGATIVE 03/30/2015 1821   NITRITE NEGATIVE 03/30/2015 1821   NITRITE Negative 02/10/2015 0938   LEUKOCYTESUR NEGATIVE 03/30/2015 1821   LEUKOCYTESUR Trace* 02/10/2015 0938    Assessment & Plan:    1. Microscopic hematuria:  Negative work up in December 2016.  2. Overactive bladder The patient will be tried on mere Myrbetriq 25 mg  daily. 2 is given samples of this. If this medication is too expensive, she will contact our office and we will switch her to an anticholinergic.  Return in about 3 months (around 08/12/2015).  Hildred LaserBrian James Donnis Pecha, MD  [facility] [address], [address] [place], [place] [address] [phone]

## 2015-07-29 ENCOUNTER — Ambulatory Visit: Payer: Self-pay | Admitting: Internal Medicine

## 2015-07-29 ENCOUNTER — Encounter: Payer: Self-pay | Admitting: Internal Medicine

## 2015-07-29 ENCOUNTER — Ambulatory Visit (INDEPENDENT_AMBULATORY_CARE_PROVIDER_SITE_OTHER): Payer: 59 | Admitting: Internal Medicine

## 2015-07-29 VITALS — BP 110/78 | HR 70 | Temp 98.5°F | Resp 16 | Ht 62.0 in | Wt 144.0 lb

## 2015-07-29 DIAGNOSIS — N910 Primary amenorrhea: Secondary | ICD-10-CM | POA: Diagnosis not present

## 2015-07-29 DIAGNOSIS — Z3201 Encounter for pregnancy test, result positive: Secondary | ICD-10-CM | POA: Diagnosis not present

## 2015-07-29 DIAGNOSIS — Z872 Personal history of diseases of the skin and subcutaneous tissue: Secondary | ICD-10-CM | POA: Diagnosis not present

## 2015-07-29 DIAGNOSIS — N926 Irregular menstruation, unspecified: Secondary | ICD-10-CM

## 2015-07-29 LAB — POCT URINE PREGNANCY: PREG TEST UR: POSITIVE — AB

## 2015-07-29 NOTE — Progress Notes (Signed)
Date:  07/29/2015   Name:  Breanna Shelton   DOB:  October 26, 1985   MRN:  [id number]   Chief Complaint: Establish Care; Abdominal Pain; Numbness; and Allergies Abdominal Pain This is a new problem. The current episode started in the past 7 days. The onset quality is sudden. The pain is located in the periumbilical region. The pain is moderate. The quality of the pain is cramping. The abdominal pain does not radiate. Associated symptoms include nausea. Pertinent negatives include no arthralgias, diarrhea, dysuria, fever, headaches or vomiting. She has tried nothing for the symptoms. hx of ectopic pregnancy and she is worried this is another one  Urticaria This is a recurrent problem. The affected locations include the groin, abdomen and torso. The rash is characterized by itchiness. Associated with: triggered by exposure to cold. Pertinent negatives include no diarrhea, fatigue, fever, shortness of breath or vomiting. Past treatments include nothing. (Hx of ectopic pregnancy and she is worried this is another one)  She is one week late on her period and had 2 positive home pregnancy tests.  She has a history of a tubal pregnancy treated with oral medication (?MTX) and did not require surgery.  Her pregnancy hx is 3 normal pregnancies, one ectopic and one pseudocyesis. Numbness - she has intermittent numbness in fingers and hands when she sits or reclines.  She has no pain or weakness.  The symptoms last only a few minutes.    Review of Systems  Constitutional: Negative for fever, chills, fatigue and unexpected weight change.  Respiratory: Negative for chest tightness and shortness of breath.   Cardiovascular: Negative for chest pain, palpitations and leg swelling.  Gastrointestinal: Positive for nausea and abdominal pain. Negative for vomiting and diarrhea.  Genitourinary: Negative for dysuria.  Musculoskeletal: Negative for arthralgias and gait problem.  Skin: Positive for rash.  Neurological:  Positive for numbness. Negative for dizziness, syncope, weakness and headaches.    There are no active problems to display for this patient.   Prior to Admission medications   Not on File    No Known Allergies  History reviewed. No pertinent past surgical history.  Social History  Substance Use Topics  . Smoking status: Never Smoker   . Smokeless tobacco: Never Used  . Alcohol Use: No     Medication list has been reviewed and updated.   Physical Exam  Constitutional: She is oriented to person, place, and time. She appears well-developed. No distress.  HENT:  Head: Normocephalic and atraumatic.  Eyes: Pupils are equal, round, and reactive to light.  Neck: Normal range of motion. Neck supple.  Cardiovascular: Normal rate, regular rhythm and normal heart sounds.   Pulmonary/Chest: Effort normal and breath sounds normal. No respiratory distress.  Abdominal: Soft. There is no hepatosplenomegaly. There is tenderness in the right lower quadrant and left lower quadrant. There is no CVA tenderness.  Musculoskeletal: Normal range of motion.  Tinel's and Phalen's negative bilaterally  Neurological: She is alert and oriented to person, place, and time.  Skin: Skin is warm, dry and intact. No rash noted.  Psychiatric: She has a normal mood and affect. Her behavior is normal. Thought content normal.  Nursing note and vitals reviewed.   BP 110/78 mmHg  Pulse 70  Temp(Src) 98.5 F (36.9 C) (Oral)  Resp 16  Ht 5\' 2"  (1.575 m)  Wt 144 lb (65.318 kg)  BMI 26.33 kg/m2  SpO2 100%  LMP 06/22/2015 (Approximate)  Assessment and Plan: 1. Encounter for pregnancy test,  result positive - Ambulatory referral to Obstetrics / Gynecology  2. History of cold-induced urticaria Discussion regarding cause and prevention No medication while pregnant  3. Menstrual period late - POCT urine pregnancy   Bari Edward, MD [facility]  07/29/2015

## 2015-08-06 ENCOUNTER — Encounter: Payer: Self-pay | Admitting: *Deleted

## 2015-08-06 ENCOUNTER — Emergency Department
Admission: EM | Admit: 2015-08-06 | Discharge: 2015-08-06 | Disposition: A | Discharge status: Home / Self Care | Payer: Self-pay | Attending: Student | Admitting: Student

## 2015-08-06 ENCOUNTER — Emergency Department: Payer: Self-pay

## 2015-08-06 DIAGNOSIS — O26899 Other specified pregnancy related conditions, unspecified trimester: Secondary | ICD-10-CM

## 2015-08-06 DIAGNOSIS — Z3A08 8 weeks gestation of pregnancy: Secondary | ICD-10-CM | POA: Insufficient documentation

## 2015-08-06 DIAGNOSIS — R109 Unspecified abdominal pain: Secondary | ICD-10-CM

## 2015-08-06 DIAGNOSIS — R102 Pelvic and perineal pain: Secondary | ICD-10-CM

## 2015-08-06 DIAGNOSIS — O200 Threatened abortion: Secondary | ICD-10-CM | POA: Insufficient documentation

## 2015-08-06 HISTORY — DX: Complete or unspecified spontaneous abortion without complication: O03.9

## 2015-08-06 LAB — URINALYSIS COMPLETE WITH MICROSCOPIC (ARMC ONLY)
Bacteria, UA: NONE SEEN
Bilirubin Urine: NEGATIVE
Glucose, UA: NEGATIVE mg/dL
Hgb urine dipstick: NEGATIVE
Ketones, ur: NEGATIVE mg/dL
Leukocytes, UA: NEGATIVE
Nitrite: NEGATIVE
Protein, ur: NEGATIVE mg/dL
RBC / HPF: NONE SEEN RBC/hpf (ref 0–5)
Specific Gravity, Urine: 1.001 — ABNORMAL LOW (ref 1.005–1.030)
Squamous Epithelial / HPF: NONE SEEN
WBC, UA: NONE SEEN WBC/hpf (ref 0–5)
pH: 6 (ref 5.0–8.0)

## 2015-08-06 LAB — CBC
HCT: 36.2 % (ref 35.0–47.0)
Hemoglobin: 12.3 g/dL (ref 12.0–16.0)
MCH: 30.1 pg (ref 26.0–34.0)
MCHC: 33.9 g/dL (ref 32.0–36.0)
MCV: 88.7 fL (ref 80.0–100.0)
Platelets: 222 K/uL (ref 150–440)
RBC: 4.08 MIL/uL (ref 3.80–5.20)
RDW: 12.5 % (ref 11.5–14.5)
WBC: 11.2 K/uL — ABNORMAL HIGH (ref 3.6–11.0)

## 2015-08-06 LAB — COMPREHENSIVE METABOLIC PANEL WITH GFR
ALT: 16 U/L (ref 14–54)
AST: 18 U/L (ref 15–41)
Albumin: 4.1 g/dL (ref 3.5–5.0)
Alkaline Phosphatase: 62 U/L (ref 38–126)
Anion gap: 7 (ref 5–15)
BUN: 13 mg/dL (ref 6–20)
CO2: 22 mmol/L (ref 22–32)
Calcium: 9.3 mg/dL (ref 8.9–10.3)
Chloride: 107 mmol/L (ref 101–111)
Creatinine, Ser: 0.53 mg/dL (ref 0.44–1.00)
GFR calc Af Amer: >60 mL/min
GFR calc non Af Amer: >60 mL/min
Glucose, Bld: 91 mg/dL (ref 65–99)
Potassium: 3.4 mmol/L — ABNORMAL LOW (ref 3.5–5.1)
Sodium: 136 mmol/L (ref 135–145)
Total Bilirubin: 0.2 mg/dL — ABNORMAL LOW (ref 0.3–1.2)
Total Protein: 7.3 g/dL (ref 6.5–8.1)

## 2015-08-06 LAB — LIPASE, BLOOD: Lipase: 26 U/L (ref 11–51)

## 2015-08-06 LAB — HCG, QUANTITATIVE, PREGNANCY: HCG, BETA CHAIN, QUANT, S: 166252 m[IU]/mL — AB (ref <5)

## 2015-08-06 NOTE — ED Notes (Addendum)
Pt presents w/ c/o pelvic and suprapubic pain. Pt seen by OB x 2 days after pain started. Pt is G6P3A2. Pt denies vaginal bleed and discharge. Pt states pain started getting worse around midnight tonight. Pt took tylenol w/o relief. Pt also c/o chills.

## 2015-08-06 NOTE — ED Provider Notes (Signed)
[facility] Emergency Department Provider Note   ____________________________________________  Time seen: Approximately 7:00 AM  I have reviewed the triage vital signs and the nursing notes.   HISTORY  Chief Complaint Pelvic Pain  Language barrier overcome with the use of the [address] Interpreter, Trudy.  HPI Breanna Shelton is a [age] y.o. 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 No nausea, vomiting, diarrhea, or fevers. She has had cold chills from time to time she denies any abnormal vaginal bleeding or vaginal discharge.   Past Medical History  Diagnosis Date  . Miscarriage     x 2    There are no active problems to display for this patient.   History reviewed. No pertinent past surgical history.  No current outpatient prescriptions on file.  Allergies Review of patient's allergies indicates no known allergies.  History reviewed. No pertinent family history.  Social History Social History  Substance Use Topics  . Smoking status: Never Smoker   . Smokeless tobacco: Never Used  . Alcohol Use: No    Review of Systems Constitutional: No fever. +chills Eyes: No visual changes. ENT: No sore throat. Cardiovascular: Denies chest pain. Respiratory: Denies shortness of breath. Gastrointestinal: +lower abdominal pain.  No nausea, no vomiting.  No diarrhea.  No constipation. Genitourinary: Negative for dysuria. Musculoskeletal: Negative for back pain. Skin: Negative for rash. Neurological: Negative for headaches, focal weakness or numbness.  10-point ROS otherwise  negative.  ____________________________________________   PHYSICAL EXAM:  VITAL SIGNS: ED Triage Vitals  Enc Vitals Group     BP 08/06/15 0334 103/66 mmHg     Pulse Rate 08/06/15 0334 73     Resp 08/06/15 0334 20     Temp 08/06/15 0334 98.1 F (36.7 C)     Temp Source 08/06/15 0334 Oral     SpO2 08/06/15 0334 100 %     Weight 08/06/15 0334 145 lb (65.772 kg)     Height 08/06/15 0334 5\' 2"  (1.575 m)     Head Cir --      Peak Flow --      Pain Score 08/06/15 0332 6     Pain Loc --      Pain Edu? --      Excl. in GC? --     Constitutional: Alert and oriented. Well appearing and in no acute distress. Eyes: Conjunctivae are normal. PERRL. EOMI. Head: Atraumatic. Nose: No congestion/rhinnorhea. Mouth/Throat: Mucous membranes are moist.  Oropharynx non-erythematous. Neck: No stridor.  Cardiovascular: Normal rate, regular rhythm. Grossly normal heart sounds.  Good peripheral circulation. Respiratory: Normal respiratory effort.  No retractions. Lungs CTAB. Gastrointestinal: Soft with very mild suprapubic tenderness to palpation. No rebound or guarding. Normal bowel sounds. No CVA tenderness. Genitourinary: Deferred Musculoskeletal: No lower extremity tenderness nor edema.  No joint effusions. Neurologic:  Normal speech and language. No gross focal neurologic deficits are appreciated. No gait instability. Skin:  Skin is warm, dry and intact. No rash noted. Psychiatric: Mood and affect are normal. Speech and behavior are normal.  ____________________________________________   LABS (all labs ordered are listed, but only abnormal results are displayed)  Labs Reviewed  COMPREHENSIVE METABOLIC PANEL - Abnormal; Notable for the  following:    Potassium 3.4 (*)    Total Bilirubin 0.2 (*)    All other components within normal limits  CBC - Abnormal; Notable for the following:    WBC 11.2 (*)    All other components within normal limits  URINALYSIS COMPLETEWITH MICROSCOPIC ([facility]  ONLY) - Abnormal; Notable for the following:    Color, Urine COLORLESS (*)    APPearance CLEAR (*)    Specific Gravity, Urine 1.001 (*)    All other components within normal limits  HCG, QUANTITATIVE, PREGNANCY - Abnormal; Notable for the following:    hCG, Beta Chain, Sharene Butters, [place] [id number] (*)    All other components within normal limits  LIPASE, BLOOD   ____________________________________________  EKG  none ____________________________________________  RADIOLOGY  Transvaginal ultrasound IMPRESSION: 1. Single living intrauterine gestation. 2. The estimated gestational age is 8 weeks and 0 days. ____________________________________________   PROCEDURES  Procedure(s) performed: None  Critical Care performed: No  ____________________________________________   INITIAL IMPRESSION / ASSESSMENT AND PLAN / ED COURSE  Pertinent labs & imaging results that were available during my care of the patient were reviewed by me and considered in my medical decision making (see chart for details).  Breanna Shelton is a 30 y.o. 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 On exam, she is very well-appearing and in no acute distress. Her vital signs are stable, she is afebrile. She has mild superpubic tenderness to palpation. I reviewed her labs. CBC with very mild nonspecific leukocytosis. Unremarkable CMP and lipase. Urinalysis is not consistent with infection. Beta hCG is elevated at 166,000. Suspect threatened miscarriage per she has no laterality of her pain, no rebound or guarding. We'll obtain ultrasound to rule out ectopic.  ----------------------------------------- 10:31 AM on  08/06/2015 ----------------------------------------- Patient continues to rest comfortably, she has declined any pain medications in the ER stating that her pain is well controlled. Transvaginal ultrasound shows a single live intrauterine pregnancy at 8 weeks estimated gestational age. With the assistance of the Spanish interpreter, I discussed with her that her symptoms are concerning for a threatened miscarriage. We discussed meticulous return precautions, need for close OB/GYN follow-up and she is comfortable with discharge plan. DC home. ____________________________________________   FINAL CLINICAL IMPRESSION(S) / ED DIAGNOSES  Final diagnoses:  Threatened miscarriage in early pregnancy  Abdominal pain affecting pregnancy, antepartum      NEW MEDICATIONS STARTED DURING THIS VISIT:  New Prescriptions   No medications on file     Note:  This document was prepared using Dragon voice recognition software and may include unintentional dictation errors.    Gayla Doss, MD 08/06/15 [phone]

## 2015-08-11 ENCOUNTER — Encounter: Payer: Self-pay | Admitting: Urology

## 2015-08-11 ENCOUNTER — Ambulatory Visit: Payer: Self-pay | Admitting: Urology

## 2015-09-30 ENCOUNTER — Ambulatory Visit: Payer: Self-pay | Admitting: Unknown Physician Specialty

## 2015-11-22 ENCOUNTER — Encounter: Payer: Self-pay | Admitting: Internal Medicine

## 2015-12-21 ENCOUNTER — Encounter: Payer: Self-pay | Admitting: Internal Medicine

## 2015-12-21 ENCOUNTER — Encounter: Payer: 59 | Admitting: Obstetrics and Gynecology

## 2016-01-03 ENCOUNTER — Encounter: Payer: 59 | Admitting: Obstetrics and Gynecology

## 2016-02-22 ENCOUNTER — Emergency Department
Admission: EM | Admit: 2016-02-22 | Discharge: 2016-02-22 | Disposition: A | Discharge status: Home / Self Care | Payer: Self-pay | Attending: Emergency Medicine | Admitting: Emergency Medicine

## 2016-02-22 ENCOUNTER — Emergency Department: Payer: Self-pay

## 2016-02-22 ENCOUNTER — Encounter: Payer: Self-pay | Admitting: *Deleted

## 2016-02-22 DIAGNOSIS — K5900 Constipation, unspecified: Secondary | ICD-10-CM | POA: Insufficient documentation

## 2016-02-22 DIAGNOSIS — R1084 Generalized abdominal pain: Secondary | ICD-10-CM

## 2016-02-22 LAB — URINALYSIS, COMPLETE (UACMP) WITH MICROSCOPIC
BACTERIA UA: NONE SEEN
BILIRUBIN URINE: NEGATIVE
Glucose, UA: NEGATIVE mg/dL
Hgb urine dipstick: NEGATIVE
KETONES UR: NEGATIVE mg/dL
LEUKOCYTES UA: NEGATIVE
Nitrite: NEGATIVE
PH: 6 (ref 5.0–8.0)
Protein, ur: NEGATIVE mg/dL
RBC / HPF: NONE SEEN RBC/hpf (ref 0–5)
Specific Gravity, Urine: 1.005 (ref 1.005–1.030)
WBC, UA: NONE SEEN WBC/hpf (ref 0–5)

## 2016-02-22 LAB — COMPREHENSIVE METABOLIC PANEL
ALBUMIN: 4.3 g/dL (ref 3.5–5.0)
ALT: 22 U/L (ref 14–54)
AST: 30 U/L (ref 15–41)
Alkaline Phosphatase: 71 U/L (ref 38–126)
Anion gap: 8 (ref 5–15)
BUN: 13 mg/dL (ref 6–20)
CHLORIDE: 106 mmol/L (ref 101–111)
CO2: 24 mmol/L (ref 22–32)
Calcium: 9 mg/dL (ref 8.9–10.3)
Creatinine, Ser: 0.61 mg/dL (ref 0.44–1.00)
GFR calc Af Amer: >60 mL/min (ref >60)
GFR calc non Af Amer: >60 mL/min (ref >60)
GLUCOSE: 90 mg/dL (ref 65–99)
Potassium: 3.9 mmol/L (ref 3.5–5.1)
SODIUM: 138 mmol/L (ref 135–145)
Total Bilirubin: 0.9 mg/dL (ref 0.3–1.2)
Total Protein: 8 g/dL (ref 6.5–8.1)

## 2016-02-22 LAB — CBC WITH DIFFERENTIAL/PLATELET
BASOS PCT: 1 %
Basophils Absolute: 0 10*3/uL (ref 0–0.1)
EOS PCT: 1 %
Eosinophils Absolute: 0 10*3/uL (ref 0–0.7)
HCT: 42.1 % (ref 35.0–47.0)
Hemoglobin: 14.3 g/dL (ref 12.0–16.0)
Lymphocytes Relative: 21 %
Lymphs Abs: 1.3 10*3/uL (ref 1.0–3.6)
MCH: 30.8 pg (ref 26.0–34.0)
MCHC: 34 g/dL (ref 32.0–36.0)
MCV: 90.6 fL (ref 80.0–100.0)
MONO ABS: 0.5 10*3/uL (ref 0.2–0.9)
Monocytes Relative: 8 %
Neutro Abs: 4.4 10*3/uL (ref 1.4–6.5)
Neutrophils Relative %: 71 %
PLATELETS: 199 10*3/uL (ref 150–440)
RBC: 4.65 MIL/uL (ref 3.80–5.20)
RDW: 12.6 % (ref 11.5–14.5)
WBC: 6.2 10*3/uL (ref 3.6–11.0)

## 2016-02-22 LAB — POCT PREGNANCY, URINE: Preg Test, Ur: NEGATIVE

## 2016-02-22 LAB — LIPASE, BLOOD: LIPASE: 15 U/L (ref 11–51)

## 2016-02-22 MED ORDER — POLYETHYLENE GLYCOL 3350 17 GM/SCOOP PO POWD: 1.0000 | Freq: Once | ORAL | 0 refills | Status: AC

## 2016-02-22 MED ORDER — DICYCLOMINE HCL 20 MG PO TABS: 20.0000 mg | ORAL_TABLET | Freq: Three times a day (TID) | ORAL | 0 refills | Status: DC | PRN

## 2016-02-22 MED ORDER — MAGNESIUM CITRATE PO SOLN
0.5000 | Freq: Once | ORAL | Status: AC
  Administered 2016-02-22: 0.5 via ORAL

## 2016-02-22 MED ORDER — LACTULOSE 10 GM/15ML PO SOLN
ORAL | Status: AC
  Filled 2016-02-22: qty 30

## 2016-02-22 MED ORDER — SODIUM CHLORIDE 0.9 % IV BOLUS (SEPSIS)
1000.0000 mL | Freq: Once | INTRAVENOUS | Status: AC
  Administered 2016-02-22: 1000 mL via INTRAVENOUS

## 2016-02-22 MED ORDER — DOCUSATE SODIUM 100 MG PO CAPS
100.0000 mg | ORAL_CAPSULE | Freq: Once | ORAL | Status: AC
  Administered 2016-02-22: 100 mg via ORAL

## 2016-02-22 MED ORDER — IOPAMIDOL (ISOVUE-300) INJECTION 61%
100.0000 mL | Freq: Once | INTRAVENOUS | Status: AC | PRN
  Administered 2016-02-22: 100 mL via INTRAVENOUS

## 2016-02-22 MED ORDER — DOCUSATE SODIUM 100 MG PO CAPS
ORAL_CAPSULE | ORAL | Status: AC
  Filled 2016-02-22: qty 1

## 2016-02-22 MED ORDER — IOPAMIDOL (ISOVUE-300) INJECTION 61%
30.0000 mL | Freq: Once | INTRAVENOUS | Status: AC | PRN
  Administered 2016-02-22: 30 mL via ORAL

## 2016-02-22 MED ORDER — LACTULOSE 10 GM/15ML PO SOLN
10.0000 g | Freq: Once | ORAL | Status: AC
  Administered 2016-02-22: 10 g via ORAL

## 2016-02-22 MED ORDER — MAGNESIUM CITRATE PO SOLN
ORAL | Status: AC
  Filled 2016-02-22: qty 296

## 2016-02-22 MED ORDER — DICYCLOMINE HCL 10 MG PO CAPS: 10.0000 mg | ORAL_CAPSULE | Freq: Three times a day (TID) | ORAL | 0 refills | Status: DC | PRN

## 2016-02-22 MED ORDER — POLYETHYLENE GLYCOL 3350 17 G PO PACK: 17.0000 g | PACK | Freq: Every day | ORAL | 0 refills | Status: DC

## 2016-02-22 NOTE — ED Provider Notes (Signed)
[facility] Emergency Department Provider Note  ____________________________________________   First MD Initiated Contact with Patient 02/22/16 1027     (approximate)  I have reviewed the triage vital signs and the nursing notes. Patient is Spanish-speaking and interpreter is at the bedside throughout the interactions. Breanna Shelton.    HISTORY  Chief Complaint Constipation   [facility] Kennieth FrancoisJuanez Shelton is a 30 y.o. female with a history of ectopic pregnancy as well as constipation is presenting to the emergency department having now moved her bowels in 6 days. She says that she has tried milk of Magnesia at home without success. She says that she also tried some diet modifications. Says that she is dull with constipation for a long time. Says that she is diffuse abdominal pain at this time especially the upper abdomen where feels like a burning. Says that she took a pregnancy test one day ago which says was negative.   Past Medical History:  Diagnosis Date  . Allergy   . Anemia   . History of miscarriage   . Miscarriage    x 2  . Tubal pregnancy     Patient Active Problem List   Diagnosis Date Noted  . Urinary frequency 01/18/2015  . Microscopic hematuria 01/18/2015  . Nocturia 01/18/2015  . SUI (stress urinary incontinence, female) 01/18/2015    History reviewed. No pertinent surgical history.  Prior to Admission medications   Medication Sig Start Date End Date Taking? Authorizing Provider  medroxyPROGESTERone (PROVERA) 10 MG tablet Take 1 tablet (10 mg total) by mouth daily. Patient not taking: Reported on 05/12/2015 03/30/15   Sharman CheekPhillip Stafford, MD  mirabegron ER (MYRBETRIQ) 25 MG TB24 tablet Take 1 tablet (25 mg total) by mouth daily. Patient not taking: Reported on 02/22/2016 05/12/15   Hildred LaserBrian James Budzyn, MD    Allergies Patient has no known allergies.  Family History  Problem Relation Age of Onset  . Cancer Paternal Grandmother    uterine cancer  . Urolithiasis Paternal Grandmother   . Kidney cancer Neg Hx   . Kidney disease Neg Hx   . Prostate cancer Neg Hx     Social History Social History  Substance Use Topics  . Smoking status: Never Smoker  . Smokeless tobacco: Never Used  . Alcohol use No    Review of Systems Constitutional: No fever/chills Eyes: No visual changes. ENT: No sore throat. Cardiovascular: Denies chest pain. Respiratory: Denies shortness of breath. Gastrointestinal:  No nausea, no vomiting.  No diarrhea. Genitourinary: Negative for dysuria. Musculoskeletal: Negative for back pain. Skin: Negative for rash. Neurological: Negative for headaches, focal weakness or numbness.  10-point ROS otherwise negative.  ____________________________________________   PHYSICAL EXAM:  VITAL SIGNS: ED Triage Vitals  Enc Vitals Group     BP 02/22/16 1025 112/70     Pulse Rate 02/22/16 1025 93     Resp 02/22/16 1025 20     Temp 02/22/16 1025 98.2 F (36.8 C)     Temp Source 02/22/16 1025 Oral     SpO2 02/22/16 1025 97 %     Weight 02/22/16 1022 136 lb (61.7 kg)     Height 02/22/16 1022 5\' 1"  (1.549 m)     Head Circumference --      Peak Flow --      Pain Score 02/22/16 1023 6     Pain Loc --      Pain Edu? --      Excl. in GC? --  Constitutional: Alert and oriented. Well appearing and in no acute distress. Eyes: Conjunctivae are normal. PERRL. EOMI. Head: Atraumatic. Nose: No congestion/rhinnorhea. Mouth/Throat: Mucous membranes are moist.   Neck: No stridor.   Cardiovascular: Normal rate, regular rhythm. Grossly normal heart sounds.  Respiratory: Normal respiratory effort.  No retractions. Lungs CTAB. Gastrointestinal: Soft and nontender. No distention.  Digital rectal exam without impaction. Musculoskeletal: No lower extremity tenderness nor edema.  No joint effusions. Neurologic:  Normal speech and language. No gross focal neurologic deficits are appreciated.  Skin:  Skin is  warm, dry and intact. No rash noted. Psychiatric: Mood and affect are normal. Speech and behavior are normal.  ____________________________________________   LABS (all labs ordered are listed, but only abnormal results are displayed)  Labs Reviewed  URINALYSIS, COMPLETE (UACMP) WITH MICROSCOPIC - Abnormal; Notable for the following:       Result Value   Color, Urine STRAW (*)    APPearance CLEAR (*)    Squamous Epithelial / LPF 0-5 (*)    All other components within normal limits  CBC WITH DIFFERENTIAL/PLATELET  COMPREHENSIVE METABOLIC PANEL  LIPASE, BLOOD  POC URINE PREG, ED  POCT PREGNANCY, URINE   ____________________________________________  EKG  ____________________________________________  RADIOLOGY   ____________________________________________   PROCEDURES  Procedure(s) performed:   Procedures  Critical Care performed:   ____________________________________________   INITIAL IMPRESSION / ASSESSMENT AND PLAN / ED COURSE  Pertinent labs & imaging results that were available during my care of the patient were reviewed by me and considered in my medical decision making (see chart for details).   Clinical Course as of Feb 22 1548  Wed Feb 22, 2016  1217 Patient had a small amount of stool at the enema. Says that she is still having diffuse abdominal pain. We'll proceed with lab workup as well as CAT scan for her diffuse abdominal pain. Patient informed about workup.  [DS]    Clinical Course User Index [DS] Breanna Blazeravid Matthew Schaevitz, MD   ----------------------------------------- 3:49 PM on 02/22/2016 -----------------------------------------  Patient still pending CT scan. Signed out to Dr. Roxan Hockeyobinson.  ____________________________________________   FINAL CLINICAL IMPRESSION(S) / ED DIAGNOSES  Constipation. Diffuse abdominal pain.    NEW MEDICATIONS STARTED DURING THIS VISIT:  New Prescriptions   No medications on file     Note:  This  document was prepared using Dragon voice recognition software and may include unintentional dictation errors.    Breanna Blazeravid Matthew Schaevitz, MD 02/22/16 [phone]

## 2016-02-22 NOTE — ED Notes (Signed)
Interpreter requested to update patient on plan of care. Will assess once interpreter is here.

## 2016-02-22 NOTE — ED Provider Notes (Signed)
Patient received in sign-out from Dr. Pershing ProudSchaevitz.  Workup and evaluation pending CT abd.   ----------------------------------------- 4:17 PM on 02/22/2016 -----------------------------------------  CT imaging with no evidence of acute intra-abdominal process. Will discharge patient with MiraLAX as well as Bentyl.  Patient was able to tolerate PO and was able to ambulate with a steady gait.  Have discussed with the patient and available family all diagnostics and treatments performed thus far and all questions were answered to the best of my ability. The patient demonstrates understanding and agreement with plan.    Willy EddyPatrick Arayah Krouse, MD 02/22/16 [phone]

## 2016-02-22 NOTE — ED Triage Notes (Addendum)
Pt reports constipation with no bowel movement for 6 days, pt complains of abdominal pain,interpreter used

## 2016-04-19 ENCOUNTER — Ambulatory Visit (INDEPENDENT_AMBULATORY_CARE_PROVIDER_SITE_OTHER): Payer: BLUE CROSS/BLUE SHIELD | Admitting: Obstetrics and Gynecology

## 2016-04-19 ENCOUNTER — Encounter: Payer: Self-pay | Admitting: Obstetrics and Gynecology

## 2016-04-19 VITALS — BP 121/58 | HR 64 | Ht 61.0 in | Wt 135.6 lb

## 2016-04-19 DIAGNOSIS — Z8742 Personal history of other diseases of the female genital tract: Secondary | ICD-10-CM

## 2016-04-19 DIAGNOSIS — Z87898 Personal history of other specified conditions: Secondary | ICD-10-CM

## 2016-04-19 DIAGNOSIS — E663 Overweight: Secondary | ICD-10-CM

## 2016-04-19 DIAGNOSIS — R42 Dizziness and giddiness: Secondary | ICD-10-CM | POA: Diagnosis not present

## 2016-04-19 DIAGNOSIS — R5383 Other fatigue: Secondary | ICD-10-CM

## 2016-04-19 DIAGNOSIS — Z30018 Encounter for initial prescription of other contraceptives: Secondary | ICD-10-CM

## 2016-04-19 DIAGNOSIS — N63 Unspecified lump in unspecified breast: Secondary | ICD-10-CM | POA: Diagnosis not present

## 2016-04-19 DIAGNOSIS — Z01411 Encounter for gynecological examination (general) (routine) with abnormal findings: Secondary | ICD-10-CM | POA: Diagnosis not present

## 2016-04-19 DIAGNOSIS — R102 Pelvic and perineal pain: Secondary | ICD-10-CM

## 2016-04-19 LAB — POCT URINALYSIS DIPSTICK
BILIRUBIN UA: NEGATIVE
Glucose, UA: NEGATIVE
Ketones, UA: NEGATIVE
Leukocytes, UA: NEGATIVE
NITRITE UA: NEGATIVE
Protein, UA: NEGATIVE
SPEC GRAV UA: 1.025
UROBILINOGEN UA: NEGATIVE
pH, UA: 6.5

## 2016-04-19 MED ORDER — ETONOGESTREL-ETHINYL ESTRADIOL 0.12-0.015 MG/24HR VA RING: VAGINAL_RING | VAGINAL | 12 refills | Status: DC

## 2016-04-19 NOTE — Patient Instructions (Signed)
Dolor plvico en la mujer (Pelvic Pain, Female) El dolor plvico se percibe en la parte baja del abdomen, por debajo del ombligo y Eaton Corporationentre las caderas. El dolor puede comenzar de forma repentina (agudo), reaparecer (recurrente) o durar mucho tiempo (crnico). Se considera que el dolor plvico que dura ms de seis meses es crnico. El dolor plvico puede afectar lo siguiente:  Los rganos genitales.  El sistema urinario.  El tubo digestivo.  El sistema musculoesqueltico. El dolor plvico tiene muchas causas posibles. A veces, puede ser el resultado de afecciones digestivas o urinarias, distensiones de msculos o ligamentos, o incluso trastornos genitales. A veces, la causa no se conoce. INSTRUCCIONES PARA EL CUIDADO EN EL HOGAR  Tome los medicamentos de venta libre y los recetados solamente como se lo haya indicado el mdico.  Haga reposo como se lo haya indicado el mdico.  No tenga relaciones sexuales si le causan dolor.  Lleve un registro del dolor plvico. Escriba los siguientes datos:  Cundo comenz Chief Technology Officerel dolor.  La ubicacin del dolor.  Qu cosas parecen Teacher, English as a foreign languagealiviar o intensificar el dolor, como los alimentos o la [place] Hurleymenstruacin.  Los sntomas que tiene junto con Chief Technology Officerel dolor.  Concurra a todas las visitas de control como se lo haya indicado el mdico. Esto es importante. SOLICITE ATENCIN MDICA SI:  Los medicamentos no Tourist information centre managerle alivian el dolor.  El dolor reaparece.  Aparecen nuevos sntomas.  Tiene secrecin o sangrado vaginal anormal, incluido sangrado despus de la menopausia.  Tiene fiebre o siente escalofros.  Tiene estreimiento.  Observa sangre en la orina o en la materia fecal.  La orina tiene mal olor.  Se siente mareada o dbil. SOLICITE ATENCIN MDICA DE INMEDIATO SI:  Siente dolor intenso repentinamente.  El dolor es cada vez ms intenso.  Tiene dolor intenso junto con fiebre, nuseas, vmitos o exceso de sudoracin.  Pierde la conciencia. Esta  informacin no tiene Theme park managercomo fin reemplazar el consejo del mdico. Asegrese de hacerle al mdico cualquier pregunta que tenga. Document Released: 05/18/2008 Document Revised: 07/06/2014 Document Reviewed: 12/10/2014 Elsevier Interactive Patient Education  2017 [employer]

## 2016-04-19 NOTE — Progress Notes (Signed)
GYNECOLOGY ANNUAL PHYSICAL EXAM PROGRESS NOTE  Subjective:    Breanna Shelton is a 31 y.o. 101P0000 female who presents for an annual exam.  The patient is sexually active. The patient wears seatbelts: yes. The patient participates in regular exercise: no. Has the patient ever been transfused or tattooed?: no. The patient reports that there is not domestic violence in her life.   The patient has the following complaints today:  1. Patient complains of left breast lump x 1 month, tender to palpation.  Has not increased in size. Denies nipple discharge.  2. Pelvic pain,  suprapubic and sometimes radiates to left side.  Denies urinary complaints or difficulties with bowel movements.  Is unsure if it is associated with her menstrual cycles. Pain usually lasts for several minutes, over the course of several days each month.  Pain is described as "achy". Has been ongoing for several months.     Gynecologic History  Menarche age: [age] Patient's last menstrual period was 03/26/2016. Contraception: NuvaRing vaginal inserts History of STI's: Denies Last Pap: 2015. 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  S/p normal colposcopy.  Has not had any further follow up.    OB History  Gravida Para Term Preterm AB Living  6 3 3  0 3 3  SAB TAB Ectopic Multiple Live Births  1 0 0   3    # Outcome Date GA Lbr Len/2nd Weight Sex Delivery Anes PTL Lv  6 AB 2015          5 AB 2015          4 Term 2011    F Vag-Spont   LIV  3 Term 2007   6 lb 12 oz (3.062 kg) F Vag-Spont   LIV  2 Term 2006   6 lb 10 oz (3.005 kg) M Vag-Spont   LIV  1 SAB                Past Medical History:  Diagnosis Date  . Allergy   . Anemia   . History of miscarriage   . Miscarriage    x 2  . Tubal pregnancy     No past surgical history on file.  Family History  Problem Relation Age of Onset  . Cancer Paternal Grandmother     uterine cancer  . Urolithiasis Paternal Grandmother   . Kidney cancer Neg Hx    . Kidney disease Neg Hx   . Prostate cancer Neg Hx     Social History   Social History  . Marital status: Married    Spouse name: N/A  . Number of children: N/A  . Years of education: N/A   Occupational History  . Not on file.   Social History Main Topics  . Smoking status: Never Smoker  . Smokeless tobacco: Never Used  . Alcohol use No  . Drug use: No  . Sexual activity: Yes    Birth control/ protection: None   Other Topics Concern  . Not on file   Social History Narrative   ** Merged History Encounter **       ** Merged History Encounter **        Current Outpatient Prescriptions on File Prior to Visit  Medication Sig Dispense Refill  . dicyclomine (BENTYL) 10 MG capsule Take 1 capsule (10 mg total) by mouth 3 (three) times daily as needed for spasms. 16 capsule 0  . dicyclomine (BENTYL) 20 MG tablet Take 1 tablet (  20 mg total) by mouth 3 (three) times daily as needed for spasms. 20 tablet 0  . medroxyPROGESTERone (PROVERA) 10 MG tablet Take 1 tablet (10 mg total) by mouth daily. (Patient not taking: Reported on 05/12/2015) 7 tablet 0  . mirabegron ER (MYRBETRIQ) 25 MG TB24 tablet Take 1 tablet (25 mg total) by mouth daily. (Patient not taking: Reported on 02/22/2016) 30 tablet 11  . polyethylene glycol (MIRALAX / GLYCOLAX) packet Take 17 g by mouth daily. Mix one tablespoon with 8oz of your favorite juice or water every day until you are having soft formed stools. Then start taking once daily if you didn't have a stool the day before. 30 each 0   No current facility-administered medications on file prior to visit.     No Known Allergies    Review of Systems Constitutional: negative for chills, fatigue, fevers and sweats Eyes: negative for irritation, redness and visual disturbance Ears, nose, mouth, throat, and face: negative for hearing loss, nasal congestion, snoring and tinnitus Respiratory: negative for asthma, cough, sputum Cardiovascular: negative for  chest pain, dyspnea, exertional chest pressure/discomfort, irregular heart beat, palpitations and syncope Gastrointestinal: negative for abdominal pain, change in bowel habits, nausea and vomiting Genitourinary: positive for pelvic pain.  Negative for abnormal menstrual periods, genital lesions, sexual problems and vaginal discharge, dysuria and urinary incontinence Integument/breast: positive for breast lump, breast tenderness.   Negative and nipple discharge Hematologic/lymphatic: negative for bleeding and easy bruising Musculoskeletal:negative for back pain and muscle weakness Neurological: negative for dizziness, headaches, vertigo and weakness Endocrine: negative for diabetic symptoms including polydipsia, polyuria and skin dryness Allergic/Immunologic: negative for hay fever and urticaria        Objective:  Blood pressure (!) 121/58, pulse 64, height 5\' 1"  (1.549 m), weight 135 lb 9.6 oz (61.5 kg), last menstrual period 03/26/2016, not currently breastfeeding. Body mass index is 25.62 kg/m.  General Appearance:    Alert, cooperative, no distress, appears stated age, overweight. + fatigue  Head:    Normocephalic, without obvious abnormality, atraumatic  Eyes:    PERRL, conjunctiva/corneas clear, EOM's intact, both eyes  Ears:    Normal external ear canals, both ears  Nose:   Nares normal, septum midline, mucosa normal, no drainage or sinus tenderness  Throat:   Lips, mucosa, and tongue normal; teeth and gums normal  Neck:   Supple, symmetrical, trachea midline, no adenopathy; thyroid: no enlargement/tenderness/nodules; no carotid bruit or JVD  Back:     Symmetric, no curvature, ROM normal, no CVA tenderness  Lungs:     Clear to auscultation bilaterally, respirations unlabored  Chest Wall:    No tenderness or deformity   Heart:    Regular rate and rhythm, S1 and S2 normal, no murmur, rub or gallop  Breast Exam:    + tenderness and small 1 x 1.5 cm mass (possible cord) in left breast ,  upper outer quadrant, mobile.  No abnormalities in right breast, no nipple abnormality bilaterally   Abdomen:     Soft, bowel sounds active all four quadrants, no masses, no organomegaly.  Mildly tender in lower abdomen.    Genitalia:    Pelvic:external genitalia normal, vagina without lesions, discharge, or tenderness, rectovaginal septum  normal. Cervix normal in appearance, no cervical motion tenderness, no adnexal masses or tenderness.  Uterus normal size, shape, mobile, regular contours, nontender.  Rectal:    Normal external sphincter.  No hemorrhoids appreciated. Internal exam not done.   Extremities:   Extremities normal, atraumatic, no  cyanosis or edema  Pulses:   2+ and symmetric all extremities  Skin:   Skin color, texture, turgor normal, no rashes or lesions  Lymph nodes:   Cervical, supraclavicular, and axillary nodes normal  Neurologic:   CNII-XII intact, normal strength, sensation and reflexes throughout.  + dizziness   .  Labs:  Lab Results  Component Value Date   WBC 6.2 02/22/2016   HGB 14.3 02/22/2016   HCT 42.1 02/22/2016   MCV 90.6 02/22/2016   PLT 199 02/22/2016    Lab Results  Component Value Date   CREATININE 0.61 02/22/2016   BUN 13 02/22/2016   NA 138 02/22/2016   K 3.9 02/22/2016   CL 106 02/22/2016   CO2 24 02/22/2016    Lab Results  Component Value Date   ALT 22 02/22/2016   AST 30 02/22/2016   ALKPHOS 71 02/22/2016   BILITOT 0.9 02/22/2016    No results found for: TSH   Assessment:    Healthy female exam.   Overweight Pelvic pain  Breast lump (left side) H/o abnormal pap smear Fatigue and dizziness  Plan:     Blood tests: none ordered.  Up to date. Breast self exam technique reviewed and patient encouraged to perform self-exam monthly. Will order breast ultrasound (and mammogram if indicated).   Contraception: NuvaRing vaginal inserts. Discussed healthy lifestyle modifications. Pap smear performed. Pelvic ultrasound ordered for  pelvic pain. Encouraged to keep menstrual calendar to assess if pain is related. Enocuraged to take Ibuprofen 800 mg prn or Aleve for pain when it occurs.  Fatigue and dizziness - Recent labs with no electrolyte abnormalities or anemia. BP overall wnl (although diastolic is a slightly low).  Will check Vitamin D levels.    Hildred Laser, MD [facility]

## 2016-04-20 LAB — VITAMIN D 25 HYDROXY (VIT D DEFICIENCY, FRACTURES): VIT D 25 HYDROXY: 32.8 ng/mL (ref 30.0–100.0)

## 2016-04-21 DIAGNOSIS — Z8742 Personal history of other diseases of the female genital tract: Secondary | ICD-10-CM | POA: Insufficient documentation

## 2016-04-21 DIAGNOSIS — E663 Overweight: Secondary | ICD-10-CM | POA: Insufficient documentation

## 2016-04-26 ENCOUNTER — Other Ambulatory Visit: Payer: Self-pay | Admitting: Obstetrics and Gynecology

## 2016-04-26 ENCOUNTER — Ambulatory Visit
Admission: RE | Admit: 2016-04-26 | Discharge: 2016-04-26 | Disposition: A | Discharge status: Home / Self Care | Payer: Self-pay | Source: Ambulatory Visit | Attending: Obstetrics and Gynecology | Admitting: Obstetrics and Gynecology

## 2016-04-26 ENCOUNTER — Ambulatory Visit (INDEPENDENT_AMBULATORY_CARE_PROVIDER_SITE_OTHER): Payer: BLUE CROSS/BLUE SHIELD

## 2016-04-26 DIAGNOSIS — R928 Other abnormal and inconclusive findings on diagnostic imaging of breast: Secondary | ICD-10-CM | POA: Insufficient documentation

## 2016-04-26 DIAGNOSIS — R102 Pelvic and perineal pain: Secondary | ICD-10-CM | POA: Diagnosis not present

## 2016-04-26 DIAGNOSIS — N63 Unspecified lump in unspecified breast: Secondary | ICD-10-CM

## 2016-04-26 HISTORY — DX: Unspecified lump in unspecified breast: N63.0

## 2016-05-01 LAB — GYN REPORT: PAP SMEAR COMMENT: 0

## 2016-05-01 LAB — SPECIMEN STATUS REPORT

## 2016-05-02 ENCOUNTER — Telehealth: Payer: Self-pay

## 2016-05-02 DIAGNOSIS — Z124 Encounter for screening for malignant neoplasm of cervix: Secondary | ICD-10-CM

## 2016-05-02 NOTE — Telephone Encounter (Signed)
Called pt using interpreter services, no answer. LM for pt to call back 

## 2016-05-02 NOTE — Telephone Encounter (Signed)
-----   Message from Hildred LaserAnika Cherry, MD sent at 05/02/2016  1:56 PM EST ----- I think Breanna Shelton may have left you a message regarding this, but this patient needs to return for a repeat pap smear due to specimen being sent in an expired vial.

## 2016-05-04 NOTE — Telephone Encounter (Signed)
Called pt using interpreter services. Spoke with pt's husband (who is listed on HIP[employer]) informed him of negative u/s and the need for repeat PAP. Pt scheduled for 05/17/16 @ 3:30pm.

## 2016-05-17 ENCOUNTER — Encounter: Payer: Self-pay | Admitting: Obstetrics and Gynecology

## 2016-05-17 ENCOUNTER — Ambulatory Visit (INDEPENDENT_AMBULATORY_CARE_PROVIDER_SITE_OTHER): Payer: BLUE CROSS/BLUE SHIELD | Admitting: Obstetrics and Gynecology

## 2016-05-17 VITALS — BP 111/70 | HR 69 | Ht 61.0 in | Wt 136.5 lb

## 2016-05-17 DIAGNOSIS — Z124 Encounter for screening for malignant neoplasm of cervix: Secondary | ICD-10-CM | POA: Diagnosis not present

## 2016-05-17 NOTE — Progress Notes (Signed)
   GYNECOLOGY CLINIC PROGRESS NOTE Subjective:     Breanna Shelton is a 31 y.o. woman who comes in today for a  pap smear only. Her most recent annual exam was on 04/19/2016. Her most recent Pap smear was on 04/19/2016 and could not be read due to expired vial. Previous abnormal Pap smears: no. Contraception: NuvaRing vaginal inserts  The following portions of the patient's history were reviewed and updated as appropriate: allergies, current medications, past family history, past medical history, past social history, past surgical history and problem list.  Review of Systems A comprehensive review of systems was negative.   Objective:    BP 111/70 (BP Location: Left Arm, Patient Position: Sitting, Cuff Size: Normal)   Pulse 69   Ht 5\' 1"  (1.549 m)   Wt 136 lb 8 oz (61.9 kg)   LMP 04/23/2016 (Approximate)   BMI 25.79 kg/m  Pelvic Exam: cervix normal in appearance, external genitalia normal and vagina normal without discharge. Pap smear obtained.   Assessment:    Screening pap smear.   Plan:    Follow up in 1 year, or as indicated by Pap results.     Hildred LaserAnika Andrzej Scully, MD [facility]

## 2016-05-19 LAB — PAP IG, CT-NG, RFX HPV ASCU
Chlamydia, Nuc. Acid Amp: NEGATIVE
Gonococcus by Nucleic Acid Amp: NEGATIVE
PAP SMEAR COMMENT: 0

## 2016-05-22 ENCOUNTER — Telehealth: Payer: Self-pay

## 2016-05-22 NOTE — Telephone Encounter (Signed)
-----   Message from Hildred LaserAnika Cherry, MD sent at 05/21/2016  9:37 AM EDT ----- Please inform of normal pap smear.

## 2016-05-22 NOTE — Telephone Encounter (Signed)
Called pt using interpreter services, no answer. LM for pt to call back

## 2016-05-24 NOTE — Telephone Encounter (Signed)
Called pt using interpreter services, no answer. LM for pt to call back 

## 2016-06-20 ENCOUNTER — Emergency Department
Admission: EM | Admit: 2016-06-20 | Discharge: 2016-06-20 | Disposition: A | Discharge status: Home / Self Care | Payer: Self-pay | Attending: Emergency Medicine | Admitting: Emergency Medicine

## 2016-06-20 DIAGNOSIS — R1013 Epigastric pain: Secondary | ICD-10-CM | POA: Insufficient documentation

## 2016-06-20 LAB — COMPREHENSIVE METABOLIC PANEL
ALBUMIN: 3.7 g/dL (ref 3.5–5.0)
ALT: 13 U/L — AB (ref 14–54)
AST: 19 U/L (ref 15–41)
Alkaline Phosphatase: 55 U/L (ref 38–126)
Anion gap: 5 (ref 5–15)
BILIRUBIN TOTAL: 0.4 mg/dL (ref 0.3–1.2)
BUN: 14 mg/dL (ref 6–20)
CALCIUM: 9 mg/dL (ref 8.9–10.3)
CO2: 25 mmol/L (ref 22–32)
CREATININE: 0.84 mg/dL (ref 0.44–1.00)
Chloride: 107 mmol/L (ref 101–111)
GFR calc Af Amer: >60 mL/min (ref >60)
GLUCOSE: 90 mg/dL (ref 65–99)
Potassium: 3.7 mmol/L (ref 3.5–5.1)
SODIUM: 137 mmol/L (ref 135–145)
Total Protein: 7.1 g/dL (ref 6.5–8.1)

## 2016-06-20 LAB — CBC
HCT: 38.1 % (ref 35.0–47.0)
Hemoglobin: 12.9 g/dL (ref 12.0–16.0)
MCH: 30.8 pg (ref 26.0–34.0)
MCHC: 34 g/dL (ref 32.0–36.0)
MCV: 90.7 fL (ref 80.0–100.0)
PLATELETS: 274 10*3/uL (ref 150–440)
RBC: 4.19 MIL/uL (ref 3.80–5.20)
RDW: 12.7 % (ref 11.5–14.5)
WBC: 5.2 10*3/uL (ref 3.6–11.0)

## 2016-06-20 LAB — URINALYSIS, COMPLETE (UACMP) WITH MICROSCOPIC
BACTERIA UA: NONE SEEN
BILIRUBIN URINE: NEGATIVE
Glucose, UA: NEGATIVE mg/dL
Hgb urine dipstick: NEGATIVE
Ketones, ur: NEGATIVE mg/dL
Leukocytes, UA: NEGATIVE
Nitrite: NEGATIVE
Protein, ur: NEGATIVE mg/dL
Specific Gravity, Urine: 1.018 (ref 1.005–1.030)
pH: 6 (ref 5.0–8.0)

## 2016-06-20 LAB — POCT PREGNANCY, URINE: Preg Test, Ur: NEGATIVE

## 2016-06-20 LAB — HCG, QUANTITATIVE, PREGNANCY

## 2016-06-20 LAB — LIPASE, BLOOD: LIPASE: 32 U/L (ref 11–51)

## 2016-06-20 MED ORDER — DICYCLOMINE HCL 10 MG PO CAPS
ORAL_CAPSULE | ORAL | Status: AC
  Filled 2016-06-20: qty 2

## 2016-06-20 MED ORDER — GI COCKTAIL ~~LOC~~
ORAL | Status: AC
  Filled 2016-06-20: qty 30

## 2016-06-20 MED ORDER — GI COCKTAIL ~~LOC~~
30.0000 mL | Freq: Once | ORAL | Status: AC
  Administered 2016-06-20: 30 mL via ORAL

## 2016-06-20 MED ORDER — DICYCLOMINE HCL 10 MG PO CAPS
20.0000 mg | ORAL_CAPSULE | Freq: Once | ORAL | Status: AC
  Administered 2016-06-20: 20 mg via ORAL

## 2016-06-20 NOTE — ED Notes (Signed)
Pt attempted to provide a stool specimen. Unable at this time.

## 2016-06-20 NOTE — ED Provider Notes (Addendum)
[facility] Emergency Department Provider Note  ____________________________________________   First MD Initiated Contact with Patient 06/20/16 2018     (approximate)  I have reviewed the triage vital signs and the nursing notes.   HISTORY  Chief Complaint Abdominal Pain    HPI Breanna Shelton Breanna Shelton is a 31 y.o. female who self presents to the emergency department with moderate severity aching cramping upper abdominal pain that began last night. It all began when she went out to eat and ate a meal with her friend including a negative restaurant. Shortly thereafter she began to have burning cramping upper abdominal pain. Her friend did not eat the same food. The patient has had no diarrhea. She did note some bright red blood in her stool today but she had no dyschezia. 3 weeks ago the patient was diagnosed with H. pylori and has been taking triple therapy which is improved her symptoms. She had no pain until last night. She has had no diarrhea, however is concerned that she may have salmonella secondary to a new's report.   Past Medical History:  Diagnosis Date  . Allergy   . Anemia   . Breast mass   . History of miscarriage   . Miscarriage    x 2  . Tubal pregnancy     Patient Active Problem List   Diagnosis Date Noted  . Overweight (BMI 25.0-29.9) 04/21/2016  . History of abnormal cervical Pap smear 04/21/2016  . Urinary frequency 01/18/2015  . Microscopic hematuria 01/18/2015  . Nocturia 01/18/2015  . SUI (stress urinary incontinence, female) 01/18/2015    No past surgical history on file.  Prior to Admission medications   Medication Sig Start Date End Date Taking? Authorizing Provider  etonogestrel-ethinyl estradiol (NUVARING) 0.12-0.015 MG/24HR vaginal ring Insert vaginally and leave in place for 3 consecutive weeks, then remove for 1 week. 04/19/16   Hildred Laser, MD    Allergies Patient has no known allergies.  Family History   Problem Relation Age of Onset  . Cancer Paternal Grandmother     uterine cancer  . Urolithiasis Paternal Grandmother   . Thyroid disease Mother   . Thyroid disease Maternal Grandmother   . Kidney cancer Neg Hx   . Kidney disease Neg Hx   . Prostate cancer Neg Hx     Social History Social History  Substance Use Topics  . Smoking status: Never Smoker  . Smokeless tobacco: Never Used  . Alcohol use Yes     Comment: Socially     Review of Systems Constitutional: No fever/chills Eyes: No visual changes. ENT: No sore throat. Cardiovascular: Denies chest pain. Respiratory: Denies shortness of breath. Gastrointestinal: Positive abdominal pain.  Positive nausea, positive vomiting.  No diarrhea.  No constipation. Genitourinary: Negative for dysuria. Musculoskeletal: Negative for back pain. Skin: Negative for rash. Neurological: Negative for headaches, focal weakness or numbness.  10-point ROS otherwise negative.  ____________________________________________   PHYSICAL EXAM:  VITAL SIGNS: ED Triage Vitals  Enc Vitals Group     BP 06/20/16 1958 123/88     Pulse Rate 06/20/16 1958 65     Resp 06/20/16 1958 18     Temp 06/20/16 1958 98.1 F (36.7 C)     Temp Source 06/20/16 1958 Oral     SpO2 06/20/16 1958 99 %     Weight 06/20/16 1957 136 lb (61.7 kg)     Height 06/20/16 1957 5' (1.524 m)     Head Circumference --  Peak Flow --      Pain Score 06/20/16 1957 5     Pain Loc --      Pain Edu? --      Excl. in GC? --     Constitutional: Alert and oriented x 4 well appearing nontoxic no diaphoresis speaks in full, clear sentences Eyes: PERRL EOMI. Head: Atraumatic. Nose: No congestion/rhinnorhea. Mouth/Throat: No trismus Neck: No stridor.   Cardiovascular: Normal rate, regular rhythm. Grossly normal heart sounds.  Good peripheral circulation. Respiratory: Normal respiratory effort.  No retractions. Lungs CTAB and moving good air Gastrointestinal: Soft  nondistended nontender no rebound no guarding no peritonitis no McBurney's tenderness negative Rovsing's no costovertebral tenderness negative Murphy'sRectal exam chaperoned by female nurse normal external exam guaiac-negative control positive brown stool Musculoskeletal: No lower extremity edema   Neurologic:  Normal speech and language. No gross focal neurologic deficits are appreciated. Skin:  Skin is warm, dry and intact. No rash noted. Psychiatric: Mood and affect are normal. Speech and behavior are normal.    _______________________________________   LABS (all labs ordered are listed, but only abnormal results are displayed)  Labs Reviewed  COMPREHENSIVE METABOLIC PANEL - Abnormal; Notable for the following:       Result Value   ALT 13 (*)    All other components within normal limits  URINALYSIS, COMPLETE (UACMP) WITH MICROSCOPIC - Abnormal; Notable for the following:    Color, Urine YELLOW (*)    APPearance CLEAR (*)    Squamous Epithelial / LPF 6-30 (*)    All other components within normal limits  GASTROINTESTINAL PANEL BY PCR, STOOL (REPLACES STOOL CULTURE)  CBC  LIPASE, BLOOD  HCG, QUANTITATIVE, PREGNANCY  POCT PREGNANCY, URINE    Not pregnant normal labs no signs of infection __________________________________________  EKG  ED ECG REPORT I, Merrily Brittle, the attending physician, personally viewed and interpreted this ECG.  Rate: 64 Rhythm: normal sinus rhythm QRS Axis: normal Intervals: normal ST/T Wave abnormalities: normal Conduction Disturbances: none Narrative Interpretation: unremarkable  ____________________________________________  RADIOLOGY   ____________________________________________   PROCEDURES  Procedure(s) performed: no  Procedures  Critical Care performed: no  ____________________________________________   INITIAL IMPRESSION / ASSESSMENT AND PLAN / ED COURSE  Pertinent labs & imaging results that were available during my  care of the patient were reviewed by me and considered in my medical decision making (see chart for details).  The patient arrives very well-appearing and hemodynamically stable. Her primary concern is that she may have salmonella given the recent news reports. I gave her reassurance that without profound diarrhea she would not likely have salmonella and this gave her reassurance. After a GI cocktail and dicyclomine her pain is nearly resolved and she is comfortable following up with her primary care physician. Her abdominal discomfort is likely dyspepsia after her meal. She is discharged home in improved and good condition.      ____________________________________________   FINAL CLINICAL IMPRESSION(S) / ED DIAGNOSES  Final diagnoses:  Epigastric pain      NEW MEDICATIONS STARTED DURING THIS VISIT:  Discharge Medication List as of 06/20/2016  9:02 PM       Note:  This document was prepared using Dragon voice recognition software and may include unintentional dictation errors.     Merrily Brittle, MD 06/22/16 1325    Merrily Brittle, MD 07/03/16 [phone]

## 2016-06-20 NOTE — ED Triage Notes (Addendum)
Patient ambulatory to triage with steady gait, without difficulty, skin slightly pale; pt reports dx with "bacterial infection" in stomach; taking amoxi, clarithromycin & omeprazole, feeling better; ate eggs yesterday and began having abd cramping, fever, chills & HA; today having bright blood in stool accomp by nausea and mid upper abd pain

## 2016-06-20 NOTE — Discharge Instructions (Signed)
Please continue taking her medications for acid reflux as prescribed. Return to the emergency department for any new or worsening symptoms such as worsening pain, fevers, chills, or for any other concerns.  Keep your appointment to see your PMD on April 30 as scheduled.  It was a pleasure to take care of you today, and thank you for coming to our emergency department.  If you have any questions or concerns before leaving please ask the nurse to grab me and I'm more than happy to go through your aftercare instructions again.  If you were prescribed any opioid pain medication today such as Norco, Vicodin, Percocet, morphine, hydrocodone, or oxycodone please make sure you do not drive when you are taking this medication as it can alter your ability to drive safely.  If you have any concerns once you are home that you are not improving or are in fact getting worse before you can make it to your follow-up appointment, please do not hesitate to call [phone] and come back for further evaluation.  Merrily Brittle MD  Results for orders placed or performed during the hospital encounter of 06/20/16  CBC  Result Value Ref Range   WBC 5.2 3.6 - 11.0 K/uL   RBC 4.19 3.80 - 5.20 MIL/uL   Hemoglobin 12.9 12.0 - 16.0 g/dL   HCT 81.1 91.4 - 78.2 %   MCV 90.7 80.0 - 100.0 fL   MCH 30.8 26.0 - 34.0 pg   MCHC 34.0 32.0 - 36.0 g/dL   RDW 95.6 21.3 - 08.6 %   Platelets 274 150 - 440 K/uL  Comprehensive metabolic panel  Result Value Ref Range   Sodium 137 135 - 145 mmol/L   Potassium 3.7 3.5 - 5.1 mmol/L   Chloride 107 101 - 111 mmol/L   CO2 25 22 - 32 mmol/L   Glucose, Bld 90 65 - 99 mg/dL   BUN 14 6 - 20 mg/dL   Creatinine, Ser 5.78 0.44 - 1.00 mg/dL   Calcium 9.0 8.9 - 46.9 mg/dL   Total Protein 7.1 6.5 - 8.1 g/dL   Albumin 3.7 3.5 - 5.0 g/dL   AST 19 15 - 41 U/L   ALT 13 (L) 14 - 54 U/L   Alkaline Phosphatase 55 38 - 126 U/L   Total Bilirubin 0.4 0.3 - 1.2 mg/dL   GFR calc non Af Amer >60 >60 mL/min   GFR calc Af Amer >60 >60 mL/min   Anion gap 5 5 - 15  Urinalysis, Complete w Microscopic  Result Value Ref Range   Color, Urine YELLOW (A) YELLOW   APPearance CLEAR (A) CLEAR   Specific Gravity, Urine 1.018 1.005 - 1.030   pH 6.0 5.0 - 8.0   Glucose, UA NEGATIVE NEGATIVE mg/dL   Hgb urine dipstick NEGATIVE NEGATIVE   Bilirubin Urine NEGATIVE NEGATIVE   Ketones, ur NEGATIVE NEGATIVE mg/dL   Protein, ur NEGATIVE NEGATIVE mg/dL   Nitrite NEGATIVE NEGATIVE   Leukocytes, UA NEGATIVE NEGATIVE   RBC / HPF 0-5 0 - 5 RBC/hpf   WBC, UA 0-5 0 - 5 WBC/hpf   Bacteria, UA NONE SEEN NONE SEEN   Squamous Epithelial / LPF 6-30 (A) NONE SEEN   Mucous PRESENT   Lipase, blood  Result Value Ref Range   Lipase 32 11 - 51 U/L  hCG, quantitative, pregnancy  Result Value Ref Range   hCG, Beta Chain, Quant, S <1 <5 mIU/mL  Pregnancy, urine POC  Result Value Ref Range  Preg Test, Ur NEGATIVE NEGATIVE

## 2017-04-25 ENCOUNTER — Encounter: Payer: Self-pay | Admitting: Obstetrics and Gynecology

## 2018-12-14 ENCOUNTER — Other Ambulatory Visit: Payer: Self-pay

## 2018-12-14 ENCOUNTER — Emergency Department
Admission: EM | Admit: 2018-12-14 | Discharge: 2018-12-15 | Disposition: A | Discharge status: Home / Self Care | Payer: BC Managed Care – PPO | Attending: Emergency Medicine | Admitting: Emergency Medicine

## 2018-12-14 ENCOUNTER — Encounter: Payer: Self-pay | Admitting: Emergency Medicine

## 2018-12-14 ENCOUNTER — Emergency Department: Payer: BC Managed Care – PPO

## 2018-12-14 DIAGNOSIS — O4690 Antepartum hemorrhage, unspecified, unspecified trimester: Secondary | ICD-10-CM

## 2018-12-14 DIAGNOSIS — R102 Pelvic and perineal pain: Secondary | ICD-10-CM | POA: Diagnosis not present

## 2018-12-14 DIAGNOSIS — O418X10 Other specified disorders of amniotic fluid and membranes, first trimester, not applicable or unspecified: Secondary | ICD-10-CM

## 2018-12-14 DIAGNOSIS — Z3A01 Less than 8 weeks gestation of pregnancy: Secondary | ICD-10-CM | POA: Insufficient documentation

## 2018-12-14 DIAGNOSIS — Z8759 Personal history of other complications of pregnancy, childbirth and the puerperium: Secondary | ICD-10-CM | POA: Insufficient documentation

## 2018-12-14 DIAGNOSIS — O418X11 Other specified disorders of amniotic fluid and membranes, first trimester, fetus 1: Secondary | ICD-10-CM | POA: Insufficient documentation

## 2018-12-14 DIAGNOSIS — O209 Hemorrhage in early pregnancy, unspecified: Secondary | ICD-10-CM | POA: Diagnosis present

## 2018-12-14 LAB — CBC WITH DIFFERENTIAL/PLATELET
Abs Immature Granulocytes: 0.02 10*3/uL (ref 0.00–0.07)
Basophils Absolute: 0 10*3/uL (ref 0.0–0.1)
Basophils Relative: 0 %
Eosinophils Absolute: 0.1 10*3/uL (ref 0.0–0.5)
Eosinophils Relative: 1 %
HCT: 38.3 % (ref 36.0–46.0)
Hemoglobin: 12.6 g/dL (ref 12.0–15.0)
Immature Granulocytes: 0 %
Lymphocytes Relative: 27 %
Lymphs Abs: 2.2 10*3/uL (ref 0.7–4.0)
MCH: 30.4 pg (ref 26.0–34.0)
MCHC: 32.9 g/dL (ref 30.0–36.0)
MCV: 92.5 fL (ref 80.0–100.0)
Monocytes Absolute: 0.4 10*3/uL (ref 0.1–1.0)
Monocytes Relative: 5 %
Neutro Abs: 5.5 10*3/uL (ref 1.7–7.7)
Neutrophils Relative %: 67 %
Platelets: 279 10*3/uL (ref 150–400)
RBC: 4.14 MIL/uL (ref 3.87–5.11)
RDW: 12.2 % (ref 11.5–15.5)
WBC: 8.2 10*3/uL (ref 4.0–10.5)
nRBC: 0 % (ref 0.0–0.2)

## 2018-12-14 LAB — URINALYSIS, COMPLETE (UACMP) WITH MICROSCOPIC
Bacteria, UA: NONE SEEN
Bilirubin Urine: NEGATIVE
Glucose, UA: NEGATIVE mg/dL
Ketones, ur: NEGATIVE mg/dL
Nitrite: NEGATIVE
Protein, ur: NEGATIVE mg/dL
Specific Gravity, Urine: 1.025 (ref 1.005–1.030)
pH: 6 (ref 5.0–8.0)

## 2018-12-14 LAB — HCG, QUANTITATIVE, PREGNANCY: hCG, Beta Chain, Quant, S: 231831 m[IU]/mL — ABNORMAL HIGH (ref <5)

## 2018-12-14 LAB — ABO/RH: ABO/RH(D): A POS

## 2018-12-14 NOTE — ED Triage Notes (Signed)
Information obtained via assist of interpreter ebelyn [phone]. Pt is 6-[redacted] weeks pregnant and began to have vaginal bleeding approx 20 min pta. Pt complains of pelvic pain. Pt denies needing pads to control bleeding.

## 2018-12-14 NOTE — ED Notes (Signed)
Patient transported to Ultrasound 

## 2018-12-15 NOTE — ED Provider Notes (Signed)
[facility] Emergency Department Provider Note  ____________________________________________  Time seen: Approximately 12:27 AM  I have reviewed the triage vital signs and the nursing notes.   HISTORY  Chief Complaint Vaginal Bleeding   HPI Breanna Shelton is a 33 y.o. female G6P3A2 currently at [redacted] weeks GA who presents for evaluation of vaginal bleeding.  Patient has a confirmed IUP by ultrasound done on 12/03/2018 [facility].  Started having vaginal bleeding  this evening.  Also complaining of constant moderate lower pelvic pressure.  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  No history of bleeding disorders, no dizziness, no chest pain or shortness of breath.  Past Medical History:  Diagnosis Date   Allergy    Anemia    Breast mass    History of miscarriage    Miscarriage    x 2   Tubal pregnancy     Patient Active Problem List   Diagnosis Date Noted   Overweight (BMI 25.0-29.9) 04/21/2016   History of abnormal cervical Pap smear 04/21/2016   Urinary frequency 01/18/2015   Microscopic hematuria 01/18/2015   Nocturia 01/18/2015   SUI (stress urinary incontinence, female) 01/18/2015    No past surgical history on file.  Prior to Admission medications   Medication Sig Start Date End Date Taking? Authorizing Provider  etonogestrel-ethinyl estradiol (NUVARING) 0.12-0.015 MG/24HR vaginal ring Insert vaginally and leave in place for 3 consecutive weeks, then remove for 1 week. 04/19/16   Hildred Laserherry, Anika, MD    Allergies Patient has no known allergies.  Family History  Problem Relation Age of Onset   Cancer Paternal Grandmother        uterine cancer   Urolithiasis Paternal Grandmother    Thyroid disease Mother    Thyroid disease Maternal Grandmother    Kidney cancer Neg Hx    Kidney disease Neg Hx    Prostate cancer Neg Hx     Social History Social History   Tobacco Use   Smoking status: Never  Smoker   Smokeless tobacco: Never Used  Substance Use Topics   Alcohol use: Yes    Comment: Socially    Drug use: No    Review of Systems  Constitutional: Negative for fever. Eyes: Negative for visual changes. ENT: Negative for sore throat. Neck: No neck pain  Cardiovascular: Negative for chest pain. Respiratory: Negative for shortness of breath. Gastrointestinal: Negative for abdominal pain, vomiting or diarrhea. Genitourinary: Negative for dysuria. + vaginal bleeding Musculoskeletal: Negative for back pain. Skin: Negative for rash. Neurological: Negative for headaches, weakness or numbness. Psych: No SI or HI  ____________________________________________   PHYSICAL EXAM:  VITAL SIGNS: ED Triage Vitals [12/14/18 2127]  Enc Vitals Group     BP 117/67     Pulse Rate 77     Resp 16     Temp 98.4 F (36.9 C)     Temp Source Oral     SpO2 99 %     Weight 149 lb 14.6 oz (68 kg)     Height 5\' 1"  (1.549 m)     Head Circumference      Peak Flow      Pain Score 5     Pain Loc      Pain Edu?      Excl. in GC?     Constitutional: Alert and oriented. Well appearing and in no apparent distress. HEENT:      Head: Normocephalic and atraumatic.  Eyes: Conjunctivae are normal. Sclera is non-icteric.       Mouth/Throat: Mucous membranes are moist.       Neck: Supple with no signs of meningismus. Cardiovascular: Regular rate and rhythm. No murmurs, gallops, or rubs. 2+ symmetrical distal pulses are present in all extremities. No JVD. Respiratory: Normal respiratory effort. Lungs are clear to auscultation bilaterally. No wheezes, crackles, or rhonchi.  Gastrointestinal: Soft, non tender, and non distended with positive bowel sounds. No rebound or guarding. Musculoskeletal: Nontender with normal range of motion in all extremities. No edema, cyanosis, or erythema of extremities. Neurologic: Normal speech and language. Face is symmetric. Moving all extremities. No gross  focal neurologic deficits are appreciated. Skin: Skin is warm, dry and intact. No rash noted. Psychiatric: Mood and affect are normal. Speech and behavior are normal.  ____________________________________________   LABS (all labs ordered are listed, but only abnormal results are displayed)  Labs Reviewed  HCG, QUANTITATIVE, PREGNANCY - Abnormal; Notable for the following components:      Result Value   hCG, Beta Chain, Quant, S 231,831 (*)    All other components within normal limits  URINALYSIS, COMPLETE (UACMP) WITH MICROSCOPIC - Abnormal; Notable for the following components:   Color, Urine YELLOW (*)    APPearance CLOUDY (*)    Hgb urine dipstick SMALL (*)    Leukocytes,Ua TRACE (*)    All other components within normal limits  CBC WITH DIFFERENTIAL/PLATELET  ABO/RH   ____________________________________________  EKG  none  ____________________________________________  RADIOLOGY  I have personally reviewed the images performed during this visit and I agree with the Radiologist's read.   Interpretation by Radiologist:  US Ob Less Than 14 Weeks With Ob Transvaginal  Result Date: 12/15/2018 CLINICAL DATA:  Vaginal bleeding, pelvic pain EXAM: OBSTETRIC <14 WK [place] AND TRANSVAGINAL OB US TECHNIQUE: Both transabdominal and transvaginal ultrasound examinations were performed for complete evaluation of the gestation as well as the maternal uterus, adnexal regions, and pelvic cul-de-sac. Transvaginal technique was performed to assess early pregnancy. COMPARISON:  Pelvic ultrasound 04/26/2016, prior gestational ultrasound 08/06/2015 FINDINGS: LMP: 10/23/2018 GA by LMP: 7 w  3 d EDC by LMP: 07/30/2019 Intrauterine gestational sac: Single Yolk sac:  Visualized Embryo:  Visualized Cardiac Activity: Visualized Heart Rate: 168 bpm CRL:  14.1 mm   7 w   5 d                  [place] EDC: 07/29/2019 Subchorionic hemorrhage: Small volume of hypoechoic subchorionic hemorrhage. (1.2 x 0.6 x 1.0 cm)  Maternal uterus/adnexae: Uterus is retroflexed. Left ovary is not visualized. Normal appearance of the right ovary measuring 2.9 x 1.7 x 1.6 cm. Normal follicles in the right ovary. No free fluid in the pelvis. IMPRESSION: Single viable intrauterine gestation with an estimated gestational age of [redacted] weeks, 5 days by crown-rump length measurement. Small volume of subchorionic hemorrhage. Nonvisualization of the left ovary. Retroflexed uterus. Electronically Signed   By: Lovena Le M.D.   On: 12/15/2018 00:52      ____________________________________________   PROCEDURES  Procedure(s) performed: None Procedures Critical Care performed:  None ____________________________________________   INITIAL IMPRESSION / ASSESSMENT AND PLAN / ED COURSE  33 y.o. female G76P3A2 currently at [redacted] weeks GA who presents for evaluation of vaginal bleeding.  Hemodynamically stable, stable hemoglobin at 12.6.  Blood type A+ with no indication for RhoGam.  Differential diagnoses including miscarriage versus first trimester bleeding versus ectopic pregnancy although less likely with a confirmed IUP on  ultrasound versus subchorionic hemorrhage.  Ultrasound is pending.    _________________________ 12:58 AM on 12/15/2018 -----------------------------------------  Ultrasound showing viable pregnancy with heart rate of 168 and a small subchorionic hemorrhage.  Discussed rest with patient including pelvic rest and follow-up with OB/GYN.  Discussed my standard return precautions.    As part of my medical decision making, I reviewed the following data within the electronic MEDICAL RECORD NUMBER Nursing notes reviewed and incorporated, Labs reviewed , Old chart reviewed, Radiograph reviewed , Notes from prior ED visits and  Controlled Substance Database   Patient was evaluated in Emergency Department today for the symptoms described in the history of present illness. Patient was evaluated in the context of the global COVID-19  pandemic, which necessitated consideration that the patient might be at risk for infection with the SARS-CoV-2 virus that causes COVID-19. Institutional protocols and algorithms that pertain to the evaluation of patients at risk for COVID-19 are in a state of rapid change based on information released by regulatory bodies including the CDC and federal and state organizations. These policies and algorithms were followed during the patient's care in the ED.   ____________________________________________   FINAL CLINICAL IMPRESSION(S) / ED DIAGNOSES   Final diagnoses:  Vaginal bleeding during pregnancy  Subchorionic hemorrhage of placenta in first trimester, single or unspecified fetus      NEW MEDICATIONS STARTED DURING THIS VISIT:  ED Discharge Orders    None       Note:  This document was prepared using Dragon voice recognition software and may include unintentional dictation errors.    Nita Sickle, MD 12/15/18 [phone]

## 2018-12-15 NOTE — ED Notes (Signed)
Discharge information reviewed in spanish by dr. Alfred Levins with this rn at bedside. Pt verbalizes understanding.

## 2020-04-17 ENCOUNTER — Emergency Department
Admission: EM | Admit: 2020-04-17 | Discharge: 2020-04-17 | Disposition: A | Discharge status: Home / Self Care | Payer: Self-pay | Attending: Emergency Medicine | Admitting: Emergency Medicine

## 2020-04-17 ENCOUNTER — Other Ambulatory Visit: Payer: Self-pay

## 2020-04-17 ENCOUNTER — Emergency Department: Payer: Self-pay

## 2020-04-17 DIAGNOSIS — R110 Nausea: Secondary | ICD-10-CM | POA: Insufficient documentation

## 2020-04-17 DIAGNOSIS — R1013 Epigastric pain: Secondary | ICD-10-CM | POA: Insufficient documentation

## 2020-04-17 LAB — LIPASE, BLOOD: Lipase: 31 U/L (ref 11–51)

## 2020-04-17 LAB — COMPREHENSIVE METABOLIC PANEL
ALT: 23 U/L (ref 0–44)
AST: 26 U/L (ref 15–41)
Albumin: 4.8 g/dL (ref 3.5–5.0)
Alkaline Phosphatase: 97 U/L (ref 38–126)
Anion gap: 12 (ref 5–15)
BUN: 10 mg/dL (ref 6–20)
CO2: 26 mmol/L (ref 22–32)
Calcium: 10 mg/dL (ref 8.9–10.3)
Chloride: 102 mmol/L (ref 98–111)
Creatinine, Ser: 0.73 mg/dL (ref 0.44–1.00)
GFR, Estimated: >60 mL/min (ref >60)
Glucose, Bld: 98 mg/dL (ref 70–99)
Potassium: 3.2 mmol/L — ABNORMAL LOW (ref 3.5–5.1)
Sodium: 140 mmol/L (ref 135–145)
Total Bilirubin: 0.6 mg/dL (ref 0.3–1.2)
Total Protein: 8.4 g/dL — ABNORMAL HIGH (ref 6.5–8.1)

## 2020-04-17 LAB — URINALYSIS, COMPLETE (UACMP) WITH MICROSCOPIC
Bilirubin Urine: NEGATIVE
Glucose, UA: NEGATIVE mg/dL
Ketones, ur: NEGATIVE mg/dL
Nitrite: POSITIVE — AB
Protein, ur: NEGATIVE mg/dL
Specific Gravity, Urine: 1.006 (ref 1.005–1.030)
pH: 5 (ref 5.0–8.0)

## 2020-04-17 LAB — CBC WITH DIFFERENTIAL/PLATELET
Abs Immature Granulocytes: 0.02 10*3/uL (ref 0.00–0.07)
Basophils Absolute: 0.1 10*3/uL (ref 0.0–0.1)
Basophils Relative: 1 %
Eosinophils Absolute: 0.1 10*3/uL (ref 0.0–0.5)
Eosinophils Relative: 1 %
HCT: 40.9 % (ref 36.0–46.0)
Hemoglobin: 13.5 g/dL (ref 12.0–15.0)
Immature Granulocytes: 0 %
Lymphocytes Relative: 40 %
Lymphs Abs: 3.7 10*3/uL (ref 0.7–4.0)
MCH: 29.7 pg (ref 26.0–34.0)
MCHC: 33 g/dL (ref 30.0–36.0)
MCV: 89.9 fL (ref 80.0–100.0)
Monocytes Absolute: 0.4 10*3/uL (ref 0.1–1.0)
Monocytes Relative: 5 %
Neutro Abs: 5.1 10*3/uL (ref 1.7–7.7)
Neutrophils Relative %: 53 %
Platelets: 274 10*3/uL (ref 150–400)
RBC: 4.55 MIL/uL (ref 3.87–5.11)
RDW: 11.8 % (ref 11.5–15.5)
WBC: 9.4 10*3/uL (ref 4.0–10.5)
nRBC: 0 % (ref 0.0–0.2)

## 2020-04-17 LAB — HCG, QUANTITATIVE, PREGNANCY: hCG, Beta Chain, Quant, S: 1 m[IU]/mL (ref <5)

## 2020-04-17 LAB — POC URINE PREG, ED: Preg Test, Ur: NEGATIVE

## 2020-04-17 MED ORDER — FAMOTIDINE IN NACL 20-0.9 MG/50ML-% IV SOLN
20.0000 mg | Freq: Once | INTRAVENOUS | Status: AC
  Administered 2020-04-17: 20 mg via INTRAVENOUS
  Filled 2020-04-17: qty 50

## 2020-04-17 MED ORDER — FENTANYL CITRATE (PF) 100 MCG/2ML IJ SOLN
50.0000 ug | Freq: Once | INTRAMUSCULAR | Status: AC
  Administered 2020-04-17: 50 ug via INTRAVENOUS
  Filled 2020-04-17: qty 2

## 2020-04-17 MED ORDER — ONDANSETRON 4 MG PO TBDP
4.0000 mg | ORAL_TABLET | Freq: Once | ORAL | Status: AC | PRN
  Administered 2020-04-17: 4 mg via ORAL
  Filled 2020-04-17: qty 1

## 2020-04-17 MED ORDER — ONDANSETRON HCL 4 MG/2ML IJ SOLN
4.0000 mg | Freq: Once | INTRAMUSCULAR | Status: AC
  Administered 2020-04-17: 4 mg via INTRAVENOUS
  Filled 2020-04-17: qty 2

## 2020-04-17 NOTE — ED Notes (Signed)
Pt ambulatory to the restroom without assistance at this time.  

## 2020-04-17 NOTE — ED Triage Notes (Signed)
Per interpreter, pt endorses upper middle abdominal pain starting 3 days ago. States 3 days ago she bought a laxative and took that and started having abdominal pain, she is worried she may have taken too much of the laxative. States she still has not had a bowel movement, last bowel movement was Wednesday. States pain is worse after eating and patient experiencing nausea after eating.

## 2020-04-17 NOTE — ED Provider Notes (Signed)
  Patient received in signout from Dr. Don Perking pending lipase and RUQ ultrasound.  Lipase returns within normal limits and RUQ ultrasound shows no evidence of acute pathology.  Patient's pain is well controlled and I see no evidence of pathology to preclude outpatient management.  Return precautions for the ED were discussed prior to discharge.   Delton Prairie, MD 04/17/20 [phone]

## 2020-04-17 NOTE — Discharge Instructions (Signed)
Please take Tylenol and ibuprofen/Advil for your pain.  It is safe to take them together, or to alternate them every few hours.  Take up to 1000mg of Tylenol at a time, up to 4 times per day.  Do not take more than 4000 mg of Tylenol in 24 hours.  For ibuprofen, take 400-600 mg, 4-5 times per day. ° ° °

## 2020-04-17 NOTE — ED Provider Notes (Signed)
[facility] Emergency Department Provider Note  ____________________________________________  Time seen: Approximately 6:04 AM  I have reviewed the triage vital signs and the nursing notes.   HISTORY  Chief Complaint Abdominal Pain   HPI Breanna Shelton is a 35 y.o. female no significant past medical history who presents for evaluation of abdominal pain.  Patient reports that 3 days ago she went to a [place] store to get prescription for parasites just because she wanted to cleanse her GI tract.  She was told a prescription for laxative that she has with her which is just magnesium.  She reports that she took that 3 days ago.  She has not had a bowel movement in the last 3 days.  She does have a history of constipation.  But yesterday she started having upper abdominal pain that she describes as a burning sensation which is constant and getting progressively worse.  She has had some nausea but no vomiting or diarrhea.  No fever or chills, no chest pain or shortness of breath.  She was concerned because her husband told her that she had taken an overdose of the laxative.  Patient told me that she took 1 box.  She does have the box with her and the prescription calls for 1 box of magnesium powder dissolved in a glass of water.  She denies any prior abdominal surgeries.  No dysuria or hematuria, no vaginal discharge.   Past Medical History:  Diagnosis Date  . Allergy   . Anemia   . Breast mass   . History of miscarriage   . Miscarriage    x 2  . Tubal pregnancy     Patient Active Problem List   Diagnosis Date Noted  . Overweight (BMI 25.0-29.9) 04/21/2016  . History of abnormal cervical Pap smear 04/21/2016  . Urinary frequency 01/18/2015  . Microscopic hematuria 01/18/2015  . Nocturia 01/18/2015  . SUI (stress urinary incontinence, female) 01/18/2015    History reviewed. No pertinent surgical history.  Prior to Admission medications    Medication Sig Start Date End Date Taking? Authorizing Provider  etonogestrel-ethinyl estradiol (NUVARING) 0.12-0.015 MG/24HR vaginal ring Insert vaginally and leave in place for 3 consecutive weeks, then remove for 1 week. 04/19/16   Hildred Laser, MD    Allergies Patient has no known allergies.  Family History  Problem Relation Age of Onset  . Cancer Paternal Grandmother        uterine cancer  . Urolithiasis Paternal Grandmother   . Thyroid disease Mother   . Thyroid disease Maternal Grandmother   . Kidney cancer Neg Hx   . Kidney disease Neg Hx   . Prostate cancer Neg Hx     Social History Social History   Tobacco Use  . Smoking status: Never Smoker  . Smokeless tobacco: Never Used  Substance Use Topics  . Alcohol use: Yes    Comment: Socially   . Drug use: No    Review of Systems  Constitutional: Negative for fever. Eyes: Negative for visual changes. ENT: Negative for sore throat. Neck: No neck pain  Cardiovascular: Negative for chest pain. Respiratory: Negative for shortness of breath. Gastrointestinal: + epigastric burning abdominal pain and nausea. No vomiting or diarrhea. Genitourinary: Negative for dysuria. Musculoskeletal: Negative for back pain. Skin: Negative for rash. Neurological: Negative for headaches, weakness or numbness. Psych: No SI or HI  ____________________________________________   PHYSICAL EXAM:  VITAL SIGNS: ED Triage Vitals  Enc Vitals Group  BP 04/17/20 0505 120/81     Pulse Rate 04/17/20 0505 90     Resp 04/17/20 0505 16     Temp 04/17/20 0505 98.6 F (37 C)     Temp Source 04/17/20 0505 Oral     SpO2 04/17/20 0505 100 %     Weight 04/17/20 0508 150 lb (68 kg)     Height 04/17/20 0508 5\' 1"  (1.549 m)     Head Circumference --      Peak Flow --      Pain Score 04/17/20 0506 7     Pain Loc --      Pain Edu? --      Excl. in GC? --     Constitutional: Alert and oriented. Well appearing and in no apparent  distress. HEENT:      Head: Normocephalic and atraumatic.         Eyes: Conjunctivae are normal. Sclera is non-icteric.       Mouth/Throat: Mucous membranes are moist.       Neck: Supple with no signs of meningismus. Cardiovascular: Regular rate and rhythm. No murmurs, gallops, or rubs.  Respiratory: Normal respiratory effort. Lungs are clear to auscultation bilaterally. Gastrointestinal: Soft, tender to palpation the epigastric and left upper quadrant.  Non distended with positive bowel sounds. No rebound or guarding. Genitourinary: No CVA tenderness. Musculoskeletal: No edema, cyanosis, or erythema of extremities. Neurologic: Normal speech and language. Face is symmetric. Moving all extremities. No gross focal neurologic deficits are appreciated. Skin: Skin is warm, dry and intact. No rash noted. Psychiatric: Mood and affect are normal. Speech and behavior are normal.  ____________________________________________   LABS (all labs ordered are listed, but only abnormal results are displayed)  Labs Reviewed  COMPREHENSIVE METABOLIC PANEL - Abnormal; Notable for the following components:      Result Value   Potassium 3.2 (*)    Total Protein 8.4 (*)    All other components within normal limits  HCG, QUANTITATIVE, PREGNANCY  CBC WITH DIFFERENTIAL/PLATELET  LIPASE, BLOOD  URINALYSIS, COMPLETE (UACMP) WITH MICROSCOPIC  HCG, QUANTITATIVE, PREGNANCY  POC URINE PREG, ED   ____________________________________________  EKG  ED ECG REPORT I, 04/19/20, the attending physician, personally viewed and interpreted this ECG.  Normal sinus rhythm, rate of 75, normal intervals, normal axis, no ST elevations or depressions.  Normal EKG. ____________________________________________  RADIOLOGY  I have personally reviewed the images performed during this visit and I agree with the Radiologist's read.   Interpretation by Radiologist:  DG Abdomen 1 View  Result Date:  04/17/2020 CLINICAL DATA:  Abdominal pain, belly button piercing. EXAM: ABDOMEN - 1 VIEW COMPARISON:  None. FINDINGS: The bowel gas pattern is normal. Limited evaluation for free intraperitoneal gas on this supine study. Stool throughout the ascending colon and transverse colon. Multiple phleboliths overlie the pelvis. No radio-opaque calculi or other significant radiographic abnormality are seen. IMPRESSION: Nonobstructive bowel gas pattern. Electronically Signed   By: 04/19/2020 M.D.   On: 04/17/2020 06:57      ____________________________________________   PROCEDURES  Procedure(s) performed: None Procedures Critical Care performed:  None ____________________________________________   INITIAL IMPRESSION / ASSESSMENT AND PLAN / ED COURSE   35 y.o. female no significant past medical history who presents for evaluation of burning epigastric  abdominal pain and nausea x 1 day.  Patient is extremely well-appearing in no distress with normal vital signs, abdomen is soft with tenderness to palpation the epigastric and left upper quadrant, no rebound or guarding.  Differential diagnosis including pancreatitis versus peptic ulcer disease versus gastritis versus GERD/indigestion versus gallbladder disease versus constipation.  We will give IV Pepcid and Zofran for symptom relief.  We will get a KUB and basic labs.  History gathered from patient and her husband was at bedside.  Plan discussed with both of them.  Old medical records reviewed  _________________________ 6:51 AM on 04/17/2020 -----------------------------------------  After IV Pepcid and Zofran patient still complaining of unchanged pain.  Will treat with a dose of IV fentanyl.  Labs pending.  X-ray with stool in the ascending and transverse colon but no signs of SBO or pretty severe constipation.  X-ray visualized by me and read by radiology.  Will order a right upper quadrant ultrasound.  _________________________ 7:06 AM  on 04/17/2020 -----------------------------------------  Care transferred to Dr. Delton Prairie      _____________________________________________ Please note:  Patient was evaluated in Emergency Department today for the symptoms described in the history of present illness. Patient was evaluated in the context of the global COVID-19 pandemic, which necessitated consideration that the patient might be at risk for infection with the SARS-CoV-2 virus that causes COVID-19. Institutional protocols and algorithms that pertain to the evaluation of patients at risk for COVID-19 are in a state of rapid change based on information released by regulatory bodies including the CDC and federal and state organizations. These policies and algorithms were followed during the patient's care in the ED.  Some ED evaluations and interventions may be delayed as a result of limited staffing during the pandemic.   [place] Controlled Substance Database was reviewed by me. ____________________________________________   FINAL CLINICAL IMPRESSION(S) / ED DIAGNOSES   Final diagnoses:  Epigastric abdominal pain      NEW MEDICATIONS STARTED DURING THIS VISIT:  ED Discharge Orders    None       Note:  This document was prepared using Dragon voice recognition software and may include unintentional dictation errors.    Don Perking, [place], MD 04/17/20 [phone]

## 2021-06-07 ENCOUNTER — Ambulatory Visit
Admission: EM | Admit: 2021-06-07 | Discharge: 2021-06-08 | Disposition: A | Discharge status: Home / Self Care | Payer: Self-pay | Attending: Emergency Medicine | Admitting: Emergency Medicine

## 2021-06-07 ENCOUNTER — Encounter: Payer: Self-pay | Admitting: Emergency Medicine

## 2021-06-07 DIAGNOSIS — O034 Incomplete spontaneous abortion without complication: Secondary | ICD-10-CM | POA: Insufficient documentation

## 2021-06-07 DIAGNOSIS — D649 Anemia, unspecified: Secondary | ICD-10-CM | POA: Insufficient documentation

## 2021-06-07 LAB — BASIC METABOLIC PANEL
Anion gap: 9 (ref 5–15)
BUN: 14 mg/dL (ref 6–20)
CO2: 26 mmol/L (ref 22–32)
Calcium: 9.1 mg/dL (ref 8.9–10.3)
Chloride: 101 mmol/L (ref 98–111)
Creatinine, Ser: 0.81 mg/dL (ref 0.44–1.00)
GFR, Estimated: >60 mL/min (ref >60)
Glucose, Bld: 106 mg/dL — ABNORMAL HIGH (ref 70–99)
Potassium: 3.9 mmol/L (ref 3.5–5.1)
Sodium: 136 mmol/L (ref 135–145)

## 2021-06-07 LAB — CBC
HCT: 31.7 % — ABNORMAL LOW (ref 36.0–46.0)
Hemoglobin: 10.2 g/dL — ABNORMAL LOW (ref 12.0–15.0)
MCH: 29.4 pg (ref 26.0–34.0)
MCHC: 32.2 g/dL (ref 30.0–36.0)
MCV: 91.4 fL (ref 80.0–100.0)
Platelets: 250 10*3/uL (ref 150–400)
RBC: 3.47 MIL/uL — ABNORMAL LOW (ref 3.87–5.11)
RDW: 12 % (ref 11.5–15.5)
WBC: 5.8 10*3/uL (ref 4.0–10.5)
nRBC: 1 % — ABNORMAL HIGH (ref 0.0–0.2)

## 2021-06-07 MED ORDER — KETOROLAC TROMETHAMINE 30 MG/ML IJ SOLN
30.0000 mg | Freq: Once | INTRAMUSCULAR | Status: AC
  Administered 2021-06-07: 30 mg via INTRAVENOUS
  Filled 2021-06-07: qty 1

## 2021-06-07 MED ORDER — SODIUM CHLORIDE 0.9 % IV SOLN: INTRAVENOUS | Status: DC

## 2021-06-07 NOTE — ED Provider Notes (Addendum)
[facility] Provider Note    Event Date/Time   First MD Initiated Contact with Patient 06/07/21 2258     (approximate)   History   Vaginal Bleeding   HPI  Breanna Shelton is a 36 y.o. female G7P4 who presents to the emergency department with increased vaginal bleeding.  Patient states that she was approximately 4 weeks and 5 days pregnant when she took vaginal Cytotec and oral mifepristone about 4 weeks ago.  States that her bleeding did decrease and over the weekend was almost completely gone but then yesterday she started having heavy bleeding where she is passing large clots and having lower abdominal cramping.  She states today she felt dizzy and had blurry vision.  She denies any abnormal vaginal discharge or odor.  No fevers, nausea, vomiting, diarrhea, dysuria.  She states her last menstrual period was January 27 and she did have an ultrasound at [facility] on the day of the medical abortion and states she was measuring 4 weeks and 5 days with a single IUP.  She denies any history of STDs and is sexually active with 1 female partner.  She denies putting anything into her vagina since her medical abortion.  She has not had sexual intercourse.  She has had 1 previous spontaneous miscarriage requiring a D&C and 1 previous ectopic that was treated with oral medications.  She has not followed up with [facility] since taking the medications.  She does not have a local OB/GYN.   NPO since 9pm.  History provided by patient and husband.    Past Medical History:  Diagnosis Date   Allergy    Anemia    Breast mass    History of miscarriage    Miscarriage    x 2   Tubal pregnancy     History reviewed. No pertinent surgical history.  MEDICATIONS:  Prior to Admission medications   Medication Sig Start Date End Date Taking? Authorizing Provider  etonogestrel-ethinyl estradiol (NUVARING) 0.12-0.015 MG/24HR vaginal ring Insert vaginally and  leave in place for 3 consecutive weeks, then remove for 1 week. 04/19/16   Hildred Laser, MD    Physical Exam   Triage Vital Signs: ED Triage Vitals [06/07/21 2233]  Enc Vitals Group     BP 110/89     Pulse Rate 67     Resp 17     Temp 98.6 F (37 C)     Temp Source Oral     SpO2 100 %     Weight 142 lb (64.4 kg)     Height 5\' 1"  (1.549 m)     Head Circumference      Peak Flow      Pain Score      Pain Loc      Pain Edu?      Excl. in GC?     Most recent vital signs: Vitals:   06/08/21 0130 06/08/21 0145  BP:    Pulse: 63 82  Resp:    Temp:    SpO2: 100% 100%    CONSTITUTIONAL: Alert and oriented and responds appropriately to questions. Well-appearing; well-nourished HEAD: Normocephalic, atraumatic EYES: Conjunctivae clear, pupils appear equal, sclera nonicteric ENT: normal nose; moist mucous membranes NECK: Supple, normal ROM CARD: RRR; S1 and S2 appreciated; no murmurs, no clicks, no rubs, no gallops RESP: Normal chest excursion without splinting or tachypnea; breath sounds clear and equal bilaterally; no wheezes, no rhonchi, no rales, no hypoxia or respiratory distress,  speaking full sentences ABD/GI: Normal bowel sounds; non-distended; soft, tender throughout the pelvic region without guarding or rebound BACK: The back appears normal EXT: Normal ROM in all joints; no deformity noted, no edema; no cyanosis SKIN: Normal color for age and race; warm; no rash on exposed skin NEURO: Moves all extremities equally, normal speech PSYCH: The patient's mood and manner are appropriate.   ED Results / Procedures / Treatments   LABS: (all labs ordered are listed, but only abnormal results are displayed) Labs Reviewed  CBC - Abnormal; Notable for the following components:      Result Value   RBC 3.47 (*)    Hemoglobin 10.2 (*)    HCT 31.7 (*)    nRBC 1.0 (*)    All other components within normal limits  BASIC METABOLIC PANEL - Abnormal; Notable for the following  components:   Glucose, Bld 106 (*)    All other components within normal limits  HCG, QUANTITATIVE, PREGNANCY - Abnormal; Notable for the following components:   hCG, Beta Chain, Quant, S 943 (*)    All other components within normal limits  TYPE AND SCREEN     EKG:   RADIOLOGY: My personal review and interpretation of imaging: Transvaginal ultrasound shows retained products of conception.  I have personally reviewed all radiology reports.   US PELVIC COMPLETE W TRANSVAGINAL AND TORSION R/O  Result Date: 06/08/2021 CLINICAL DATA:  Abortion 4 weeks ago. Vaginal bleeding. Evaluate for retained products of conception. EXAM: TRANSABDOMINAL AND TRANSVAGINAL ULTRASOUND OF PELVIS TECHNIQUE: Both transabdominal and transvaginal ultrasound examinations of the pelvis were performed. Transabdominal technique was performed for global imaging of the pelvis including uterus, ovaries, adnexal regions, and pelvic cul-de-sac. It was necessary to proceed with endovaginal exam following the transabdominal exam to visualize the endometrium and ovaries. COMPARISON:  None FINDINGS: Uterus Measurements: 8.3 x 4.2 x 6.0 cm = volume: 109 mL. No fibroids or other mass visualized. Endometrium Thickness: 21 mm in thickness, heterogeneous. Areas of internal blood flow. Appearance is concerning for retained products of conception. Right ovary Measurements: 3.1 x 1.5 x 1.1 cm = volume: 2.7 mL. Normal appearance/no adnexal mass. Left ovary Measurements: 3.0 x 1.2 x 0.9 cm = volume: 1.8 mL. Normal appearance/no adnexal mass. Other findings No abnormal free fluid. IMPRESSION: Thickened, heterogeneous endometrium concerning for retained products of conception. Electronically Signed   By: Charlett Nose M.D.   On: 06/08/2021 01:24     PROCEDURES:  Critical Care performed: No   CRITICAL CARE Performed by: Baxter Hire Shavonne Ambroise   Total critical care time: 0 minutes  Critical care time was exclusive of separately billable procedures  and treating other patients.  Critical care was necessary to treat or prevent imminent or life-threatening deterioration.  Critical care was time spent personally by me on the following activities: development of treatment plan with patient and/or surrogate as well as nursing, discussions with consultants, evaluation of patient's response to treatment, examination of patient, obtaining history from patient or surrogate, ordering and performing treatments and interventions, ordering and review of laboratory studies, ordering and review of radiographic studies, pulse oximetry and re-evaluation of patient's condition.   Procedures    IMPRESSION / MDM / ASSESSMENT AND PLAN / ED COURSE  I reviewed the triage vital signs and the nursing notes.    Patient here with increased vaginal bleeding, passing clots and abdominal pain after medical abortion approximately 4 weeks ago.     DIFFERENTIAL DIAGNOSIS (includes but not limited to):   Retained  products of conception, endometritis, anemia, dysmenorrhea, torsion, ovarian cyst   PLAN: We will obtain CBC, BMP, HCG, type and screen and perform transvaginal ultrasound with Doppler.  She declines STD screening today.  Will give Toradol for pain control.  Currently hemodynamically stable without signs of hemorrhage.   MEDICATIONS GIVEN IN ED: Medications  0.9 %  sodium chloride infusion ( Intravenous New Bag/Given 06/07/21 2359)  ketorolac (TORADOL) 30 MG/ML injection 30 mg (30 mg Intravenous Given 06/07/21 2347)     ED COURSE: Patient's labs show mild anemia with hemoglobin of 10.2 down from 13.5 a year ago.  No leukocytosis.  Normal electrolytes.  hCG is still 943.  Transvaginal ultrasound reviewed by myself and radiologist and shows retained products of conception.  Will discuss with OB/GYN on-call.   CONSULTS: 2:00 AM  Discussed with Dr. Logan Bores on-call for OB/GYN.  Appreciate his help.  He recommends keeping her n.p.o. and will take her to the  operating room for D&E later today around 2 PM.  Patient will not be admitted but will go directly to the operating room from the ED.  Patient and husband are comfortable with this plan.  She continues to be hemodynamically stable.   7:10 AM  Pt resting comfortably overnight.  Pain controlled with morphine.  HD stable.  Dr. Logan Bores at bedside to discuss procedure with patient using interpretor.  I reviewed all nursing notes and pertinent previous records as available.  I have reviewed and interpreted any and all EKGs, lab and urine results, imaging and radiology reports (as available).   OUTSIDE RECORDS REVIEWED: Reviewed patient's last office visit with Ali Lowe on 08/03/2020.         FINAL CLINICAL IMPRESSION(S) / ED DIAGNOSES   Final diagnoses:  Retained products of conception following abortion     Rx / DC Orders   ED Discharge Orders     None        Note:  This document was prepared using Dragon voice recognition software and may include unintentional dictation errors.   Philis Doke, Layla Maw, DO 06/08/21 1610    Wyndi Northrup, Layla Maw, DO 06/08/21 9604

## 2021-06-07 NOTE — ED Triage Notes (Signed)
Pt reports she took an abortion pill 4 weeks ago and was told to follow up at ED if bleeding worsened. Pt c/o increased vaginal bleeding with large clots x1 day with lower groin pain and pressure.  ?

## 2021-06-08 ENCOUNTER — Other Ambulatory Visit: Payer: Self-pay

## 2021-06-08 ENCOUNTER — Emergency Department: Payer: Self-pay | Admitting: Anesthesiology

## 2021-06-08 ENCOUNTER — Encounter: Payer: Self-pay | Admitting: *Deleted

## 2021-06-08 ENCOUNTER — Encounter
Admission: EM | Disposition: A | Discharge status: Home / Self Care | Payer: Self-pay | Source: Home / Self Care | Attending: Emergency Medicine

## 2021-06-08 ENCOUNTER — Emergency Department: Payer: Self-pay

## 2021-06-08 DIAGNOSIS — O0489 (Induced) termination of pregnancy with other complications: Secondary | ICD-10-CM

## 2021-06-08 HISTORY — PX: DILATION AND EVACUATION: SHX1459

## 2021-06-08 LAB — TYPE AND SCREEN
ABO/RH(D): A POS
Antibody Screen: NEGATIVE

## 2021-06-08 LAB — HCG, QUANTITATIVE, PREGNANCY: hCG, Beta Chain, Quant, S: 943 m[IU]/mL — ABNORMAL HIGH (ref <5)

## 2021-06-08 SURGERY — DILATION AND EVACUATION, UTERUS
Anesthesia: General | Site: Uterus

## 2021-06-08 MED ORDER — ONDANSETRON HCL 4 MG/2ML IJ SOLN
4.0000 mg | Freq: Four times a day (QID) | INTRAMUSCULAR | Status: DC | PRN
Start: 2021-06-08 — End: 2021-06-08
  Administered 2021-06-08: 4 mg via INTRAVENOUS
  Filled 2021-06-08: qty 2

## 2021-06-08 MED ORDER — ONDANSETRON HCL 4 MG/2ML IJ SOLN
INTRAMUSCULAR | Status: DC | PRN
  Administered 2021-06-08: 4 mg via INTRAVENOUS

## 2021-06-08 MED ORDER — MORPHINE SULFATE (PF) 4 MG/ML IV SOLN
4.0000 mg | Freq: Once | INTRAVENOUS | Status: AC
Start: 2021-06-08 — End: 2021-06-08
  Administered 2021-06-08: 4 mg via INTRAVENOUS
  Filled 2021-06-08: qty 1

## 2021-06-08 MED ORDER — ACETAMINOPHEN 10 MG/ML IV SOLN
INTRAVENOUS | Status: DC | PRN
  Administered 2021-06-08: 1000 mg via INTRAVENOUS

## 2021-06-08 MED ORDER — OXYTOCIN 10 UNIT/ML IJ SOLN
INTRAMUSCULAR | Status: DC | PRN
  Administered 2021-06-08: 20 [IU] via INTRAMUSCULAR

## 2021-06-08 MED ORDER — OXYTOCIN 10 UNIT/ML IJ SOLN
INTRAMUSCULAR | Status: AC
  Filled 2021-06-08: qty 2

## 2021-06-08 MED ORDER — FENTANYL CITRATE (PF) 100 MCG/2ML IJ SOLN
INTRAMUSCULAR | Status: AC
  Filled 2021-06-08: qty 2

## 2021-06-08 MED ORDER — LACTATED RINGERS IV SOLN: INTRAVENOUS | Status: DC

## 2021-06-08 MED ORDER — LACTATED RINGERS IV SOLN: INTRAVENOUS | Status: DC | PRN

## 2021-06-08 MED ORDER — LIDOCAINE HCL (CARDIAC) PF 100 MG/5ML IV SOSY
PREFILLED_SYRINGE | INTRAVENOUS | Status: DC | PRN
  Administered 2021-06-08: 80 mg via INTRAVENOUS

## 2021-06-08 MED ORDER — PHENYLEPHRINE 40 MCG/ML (10ML) SYRINGE FOR IV PUSH (FOR BLOOD PRESSURE SUPPORT)
PREFILLED_SYRINGE | INTRAVENOUS | Status: AC
  Filled 2021-06-08: qty 10

## 2021-06-08 MED ORDER — FENTANYL CITRATE PF 50 MCG/ML IJ SOSY
50.0000 ug | PREFILLED_SYRINGE | INTRAMUSCULAR | Status: DC | PRN
Start: 2021-06-08 — End: 2021-06-08
  Administered 2021-06-08 (×2): 50 ug via INTRAVENOUS
  Filled 2021-06-08 (×2): qty 1

## 2021-06-08 MED ORDER — DEXAMETHASONE SODIUM PHOSPHATE 10 MG/ML IJ SOLN
INTRAMUSCULAR | Status: AC
  Filled 2021-06-08: qty 1

## 2021-06-08 MED ORDER — GLYCOPYRROLATE 0.2 MG/ML IJ SOLN
INTRAMUSCULAR | Status: DC | PRN
  Administered 2021-06-08: .2 mg via INTRAVENOUS

## 2021-06-08 MED ORDER — ACETAMINOPHEN 10 MG/ML IV SOLN
INTRAVENOUS | Status: AC
  Filled 2021-06-08: qty 100

## 2021-06-08 MED ORDER — MIDAZOLAM HCL 2 MG/2ML IJ SOLN
INTRAMUSCULAR | Status: AC
  Filled 2021-06-08: qty 2

## 2021-06-08 MED ORDER — DEXAMETHASONE SODIUM PHOSPHATE 10 MG/ML IJ SOLN
INTRAMUSCULAR | Status: DC | PRN
  Administered 2021-06-08: 10 mg via INTRAVENOUS

## 2021-06-08 MED ORDER — 0.9 % SODIUM CHLORIDE (POUR BTL) OPTIME
TOPICAL | Status: DC | PRN
  Administered 2021-06-08: 500 mL

## 2021-06-08 MED ORDER — HYDROCODONE-ACETAMINOPHEN 5-325 MG PO TABS: 1.0000 | ORAL_TABLET | Freq: Four times a day (QID) | ORAL | 0 refills | Status: DC | PRN

## 2021-06-08 MED ORDER — PROPOFOL 10 MG/ML IV BOLUS
INTRAVENOUS | Status: AC
  Filled 2021-06-08: qty 20

## 2021-06-08 MED ORDER — PROPOFOL 10 MG/ML IV BOLUS
INTRAVENOUS | Status: DC | PRN
  Administered 2021-06-08: 150 mg via INTRAVENOUS

## 2021-06-08 MED ORDER — ONDANSETRON HCL 4 MG/2ML IJ SOLN
INTRAMUSCULAR | Status: AC
  Filled 2021-06-08: qty 2

## 2021-06-08 MED ORDER — POVIDONE-IODINE 10 % EX SWAB: 2.0000 "application " | Freq: Once | CUTANEOUS | Status: DC

## 2021-06-08 MED ORDER — LIDOCAINE HCL (PF) 2 % IJ SOLN
INTRAMUSCULAR | Status: AC
  Filled 2021-06-08: qty 5

## 2021-06-08 MED ORDER — PHENYLEPHRINE 40 MCG/ML (10ML) SYRINGE FOR IV PUSH (FOR BLOOD PRESSURE SUPPORT)
PREFILLED_SYRINGE | INTRAVENOUS | Status: DC | PRN
  Administered 2021-06-08: 80 ug via INTRAVENOUS

## 2021-06-08 MED ORDER — MIDAZOLAM HCL 2 MG/2ML IJ SOLN
INTRAMUSCULAR | Status: DC | PRN
  Administered 2021-06-08: 2 mg via INTRAVENOUS

## 2021-06-08 MED ORDER — GLYCOPYRROLATE 0.2 MG/ML IJ SOLN
INTRAMUSCULAR | Status: AC
  Filled 2021-06-08: qty 1

## 2021-06-08 MED ORDER — MORPHINE SULFATE (PF) 4 MG/ML IV SOLN
4.0000 mg | INTRAVENOUS | Status: DC | PRN
Start: 2021-06-08 — End: 2021-06-08
  Administered 2021-06-08: 4 mg via INTRAVENOUS
  Filled 2021-06-08: qty 1

## 2021-06-08 SURGICAL SUPPLY — 27 items
ADAPTER VACURETTE TBG SET 14 (CANNULA) IMPLANT
BACTOSHIELD CHG 4% 4OZ (MISCELLANEOUS) ×1
CUP MEDICINE 2OZ PLAST GRAD ST (MISCELLANEOUS) ×2 IMPLANT
DRSG TELFA 3X8 NADH (GAUZE/BANDAGES/DRESSINGS) ×2 IMPLANT
GLOVE SURG POLY ORTHO LF SZ7.5 (GLOVE) ×2 IMPLANT
GOWN STRL REUS W/ TWL LRG LVL3 (GOWN DISPOSABLE) ×2 IMPLANT
GOWN STRL REUS W/TWL LRG LVL3 (GOWN DISPOSABLE) ×4
GRADUATE 1200CC STRL 31836 (MISCELLANEOUS) ×2 IMPLANT
KIT TURNOVER KIT A (KITS) ×2 IMPLANT
MANIFOLD NEPTUNE II (INSTRUMENTS) ×2 IMPLANT
NDL HYPO 25X1 1.5 SAFETY (NEEDLE) ×1 IMPLANT
NEEDLE HYPO 25X1 1.5 SAFETY (NEEDLE) ×2 IMPLANT
NS IRRIG 500ML POUR BTL (IV SOLUTION) ×1 IMPLANT
PACK DNC HYST (MISCELLANEOUS) ×2 IMPLANT
PAD DRESSING TELFA 3X8 NADH (GAUZE/BANDAGES/DRESSINGS) ×1 IMPLANT
PAD OB MATERNITY 4.3X12.25 (PERSONAL CARE ITEMS) ×2 IMPLANT
PAD PREP 24X41 OB/GYN DISP (PERSONAL CARE ITEMS) ×2 IMPLANT
SCRUB CHG 4% DYNA-HEX 4OZ (MISCELLANEOUS) ×1 IMPLANT
SET BERKELEY SUCTION TUBING (SUCTIONS) ×2 IMPLANT
SET CYSTO W/LG BORE CLAMP LF (SET/KITS/TRAYS/PACK) IMPLANT
SOL PREP PVP 2OZ (MISCELLANEOUS) ×2
SOLUTION PREP PVP 2OZ (MISCELLANEOUS) ×1 IMPLANT
VACURETTE 10 RIGID CVD (CANNULA) IMPLANT
VACURETTE 12 RIGID CVD (CANNULA) IMPLANT
VACURETTE 7MM F TIP (CANNULA)
VACURETTE 7MM F TIP STRL (CANNULA) IMPLANT
VACURETTE 8 RIGID CVD (CANNULA) ×1 IMPLANT

## 2021-06-08 NOTE — Discharge Instructions (Signed)

## 2021-06-08 NOTE — Anesthesia Preprocedure Evaluation (Addendum)
Anesthesia Evaluation  ?Patient identified by MRN, date of birth, ID band ?Patient awake ? ? ? ?Reviewed: ?Allergy & Precautions, NPO status , Patient's Chart, lab work & pertinent test results ? ?Airway ?Mallampati: III ? ?TM Distance: >3 FB ?Neck ROM: full ? ? ? Dental ? ?(+) Chipped ?  ?Pulmonary ?neg pulmonary ROS,  ?  ?Pulmonary exam normal ? ? ? ? ? ? ? Cardiovascular ?negative cardio ROS ?Normal cardiovascular exam ? ? ?  ?Neuro/Psych ?negative neurological ROS ? negative psych ROS  ? GI/Hepatic ?negative GI ROS, Neg liver ROS,   ?Endo/Other  ?negative endocrine ROS ? Renal/GU ?  ? ?  ?Musculoskeletal ? ? Abdominal ?Normal abdominal exam  (+)   ?Peds ? Hematology ? ?(+) Blood dyscrasia, ,   ?Anesthesia Other Findings ?Past Medical History: ?No date: Allergy ?No date: Anemia ?No date: Breast mass ?No date: History of miscarriage ?No date: Miscarriage ?    Comment:  x 2 ?No date: Tubal pregnancy ? ?History reviewed. No pertinent surgical history. ? ?BMI   ? Body Mass Index: 26.83 kg/m?  ?  ? ? Reproductive/Obstetrics ?negative OB ROS ? ?  ? ? ? ? ? ? ? ? ? ? ? ? ? ?  ?  ? ? ? ? ? ? ? ? ?Anesthesia Physical ?Anesthesia Plan ? ?ASA: 2 ? ?Anesthesia Plan: General LMA  ? ?Post-op Pain Management:   ? ?Induction: Intravenous ? ?PONV Risk Score and Plan: Dexamethasone, Ondansetron, Midazolam and Treatment may vary due to age or medical condition ? ?Airway Management Planned: LMA ? ?Additional Equipment:  ? ?Intra-op Plan:  ? ?Post-operative Plan:  ? ?Informed Consent: I have reviewed the patients History and Physical, chart, labs and discussed the procedure including the risks, benefits and alternatives for the proposed anesthesia with the patient or authorized representative who has indicated his/her understanding and acceptance.  ? ? ? ?Dental Advisory Given ? ?Plan Discussed with: Anesthesiologist, CRNA and Surgeon ? ?Anesthesia Plan Comments:   ? ? ? ? ? ? ?Anesthesia Quick  Evaluation ? ?

## 2021-06-08 NOTE — Anesthesia Procedure Notes (Signed)
Procedure Name: LMA Insertion ?Date/Time: 06/08/2021 3:13 PM ?Performed by: Jerrye Noble, CRNA ?Pre-anesthesia Checklist: Patient identified, Emergency Drugs available, Suction available and Patient being monitored ?Patient Re-evaluated:Patient Re-evaluated prior to induction ?Oxygen Delivery Method: Circle system utilized ?Preoxygenation: Pre-oxygenation with 100% oxygen ?Induction Type: IV induction ?Ventilation: Mask ventilation without difficulty ?LMA: LMA inserted ?LMA Size: 3.5 ?Number of attempts: 1 ?Placement Confirmation: positive ETCO2 and breath sounds checked- equal and bilateral ?Tube secured with: Tape ?Dental Injury: Teeth and Oropharynx as per pre-operative assessment  ? ? ? ? ?

## 2021-06-08 NOTE — ED Notes (Signed)
ED Provider at bedside. 

## 2021-06-08 NOTE — Transfer of Care (Addendum)
Immediate Anesthesia Transfer of Care Note ? ?Patient: Breanna Shelton ? ?Procedure(s) Performed: DILATATION AND EVACUATION (Uterus) ? ?Patient Location: PACU ? ?Anesthesia Type:General ? ?Level of Consciousness: drowsy ? ?Airway & Oxygen Therapy: Patient Spontanous Breathing and Patient connected to face mask oxygen ? ?Post-op Assessment: Report given to RN and Post -op Vital signs reviewed and stable ? ?Post vital signs: Reviewed and stable ? ?Last Vitals:  ?Vitals Value Taken Time  ?BP 93/67   ?Temp    ?Pulse 86 06/08/21 1548  ?Resp 9 06/08/21 1548  ?SpO2 100 % 06/08/21 1548  ?Vitals shown include unvalidated device data. ? ?Last Pain:  ?Vitals:  ? 06/08/21 1314  ?TempSrc: Oral  ?PainSc: 4   ?   ? ?  ? ?Complications: No notable events documented. ?

## 2021-06-08 NOTE — H&P (Addendum)
? ? ?    PRE-OPERATIVE HISTORY AND PHYSICAL EXAM ? ?PCP:  [facility] ?Subjective:  ? ?HPI:  Breanna Shelton is a 36 y.o. [phone].  No LMP recorded.  She presents today for a pre-op discussion and PE. ? ?She has the following symptoms: Heavy vaginal bleeding with clots and cramps.  She underwent a medically induced abortion with Cytotec and oral mifepristone about 4 weeks ago.  Her bleeding slowed down and actually stopped but yesterday began to be very heavy with cramping and clotting.  She said she felt lightheaded and presented to the emergency department. ? ?Review of Systems:  ? ?Constitutional: Denied constitutional symptoms, night sweats, recent illness, fatigue, fever, insomnia and weight loss.  ?Eyes: Denied eye symptoms, eye pain, photophobia, vision change and visual disturbance.  ?Ears/Nose/Throat/Neck: Denied ear, nose, throat or neck symptoms, hearing loss, nasal discharge, sinus congestion and sore throat.  ?Cardiovascular: Denied cardiovascular symptoms, arrhythmia, chest pain/pressure, edema, exercise intolerance, orthopnea and palpitations.  ?Respiratory: Denied pulmonary symptoms, asthma, pleuritic pain, productive sputum, cough, dyspnea and wheezing.  ?Gastrointestinal: Denied, gastro-esophageal reflux, melena, nausea and vomiting.  ?Genitourinary: See HPI for additional information.  ?Musculoskeletal: Denied musculoskeletal symptoms, stiffness, swelling, muscle weakness and myalgia.  ?Dermatologic: Denied dermatology symptoms, rash and scar.  ?Neurologic: Denied neurology symptoms, dizziness, headache, neck pain and syncope.  ?Psychiatric: Denied psychiatric symptoms, anxiety and depression.  ?Endocrine: Denied endocrine symptoms including hot flashes and night sweats.  ? ?OB History  ?Gravida Para Term Preterm AB Living  ?7 3 3  0 3 3  ?SAB IAB Ectopic Multiple Live Births  ?1 0 0   3  ?  ?# Outcome Date GA Lbr Len/2nd Weight Sex Delivery Anes PTL Lv  ?7 Gravida           ?6 AB  2015          ?5 AB 2015          ?4 Term 2011    F Vag-Spont   LIV  ?3 Term 2007   3062 g F Vag-Spont   LIV  ?2 Term 2006   3005 g M Vag-Spont   LIV  ?1 SAB           ? ? ?Past Medical History:  ?Diagnosis Date  ? Allergy   ? Anemia   ? Breast mass   ? History of miscarriage   ? Miscarriage   ? x 2  ? Tubal pregnancy   ? ? ?History reviewed. No pertinent surgical history.   ? ?SOCIAL HISTORY: ? ?Social History  ? ?Tobacco Use  ?Smoking Status Never  ?Smokeless Tobacco Never  ? ?Social History  ? ?Substance and Sexual Activity  ?Alcohol Use Yes  ? Comment: Socially   ? ? ?Social History  ? ?Substance and Sexual Activity  ?Drug Use No  ? ? ?Family History  ?Problem Relation Age of Onset  ? Cancer Paternal Grandmother   ?     uterine cancer  ? Urolithiasis Paternal Grandmother   ? Thyroid disease Mother   ? Thyroid disease Maternal Grandmother   ? Kidney cancer Neg Hx   ? Kidney disease Neg Hx   ? Prostate cancer Neg Hx   ? ? ?ALLERGIES:  Patient has no known allergies. ? ?MEDS: ?  ?No current facility-administered medications on file prior to encounter.  ? ?Current Outpatient Medications on File Prior to Encounter  ?Medication Sig Dispense Refill  ? etonogestrel-ethinyl estradiol (NUVARING) 0.12-0.015 MG/24HR vaginal ring Insert  vaginally and leave in place for 3 consecutive weeks, then remove for 1 week. 1 each 12  ? ? ?Meds ordered this encounter  ?Medications  ? ketorolac (TORADOL) 30 MG/ML injection 30 mg  ? 0.9 %  sodium chloride infusion  ? DISCONTD: fentaNYL (SUBLIMAZE) injection 50 mcg  ? ondansetron (ZOFRAN) injection 4 mg  ? morphine (PF) 4 MG/ML injection 4 mg  ? morphine (PF) 4 MG/ML injection 4 mg  ? lactated ringers infusion  ? povidone-iodine 10 % swab 2 application.  ?  ? ?Physical examination ?BP 113/75   Pulse (!) 57   Temp 98.2 ?F (36.8 ?C) (Oral)   Resp 18   Ht 5\' 1"  (1.549 m)   Wt 64.4 kg   SpO2 96%   BMI 26.83 kg/m?  ? ?General NAD, Conversant  ?HEENT Atraumatic; Op clear with mmm.   Normo-cephalic. Pupils reactive. Anicteric sclerae  ?Thyroid/Neck Smooth without nodularity or enlargement. Normal ROM.  Neck Supple.  ?Skin No rashes, lesions or ulceration. Normal palpated skin turgor. No nodularity.  ?Breasts: No masses or discharge.  Symmetric.  No axillary adenopathy.  ?Lungs: Clear to auscultation.No rales or wheezes. Normal Respiratory effort, no retractions.  ?Heart: NSR.  No murmurs or rubs appreciated. No periferal edema  ?Abdomen: Soft.  Non-tender.  No masses.  No HSM. No hernia  ?Extremities: Moves all appropriately.  Normal ROM for age. No lymphadenopathy.  ?Neuro: Oriented to PPT.  Normal mood. Normal affect.  ? ?  Pelvic: Deferred to OR  ?  [place] : ?Uterus ?  ?Measurements: 8.3 x 4.2 x 6.0 cm = volume: 109 mL. No fibroids or ?other mass visualized. ?  ?Endometrium ?  ?Thickness: 21 mm in thickness, heterogeneous. Areas of internal ?blood flow. Appearance is concerning for retained products of ?conception. ?  ?Right ovary ?  ?Measurements: 3.1 x 1.5 x 1.1 cm = volume: 2.7 mL. Normal ?appearance/no adnexal mass. ?  ?Left ovary ?  ?Measurements: 3.0 x 1.2 x 0.9 cm = volume: 1.8 mL. Normal ?appearance/no adnexal mass. ?  ?Other findings ?  ?No abnormal free fluid. ?  ?IMPRESSION: ?Thickened, heterogeneous endometrium concerning for retained ?products of conception. ? ?Assessment:  ? ?[id number] ?Patient Active Problem List  ? Diagnosis Date Noted  ? Overweight (BMI 25.0-29.9) 04/21/2016  ? History of abnormal cervical Pap smear 04/21/2016  ? Urinary frequency 01/18/2015  ? Microscopic hematuria 01/18/2015  ? Nocturia 01/18/2015  ? SUI (stress urinary incontinence, female) 01/18/2015  ? ? ?1. Retained products of conception following abortion   ? ? ? ?Plan:  ? ?Orders: ?Meds ordered this encounter  ?Medications  ? ketorolac (TORADOL) 30 MG/ML injection 30 mg  ? 0.9 %  sodium chloride infusion  ? DISCONTD: fentaNYL (SUBLIMAZE) injection 50 mcg  ? ondansetron (ZOFRAN) injection 4 mg  ? morphine  (PF) 4 MG/ML injection 4 mg  ? morphine (PF) 4 MG/ML injection 4 mg  ? lactated ringers infusion  ? povidone-iodine 10 % swab 2 application.  ?  ? ?1.  D&E ? ?Pre-op discussions regarding Risks and Benefits of her scheduled surgery. ? ?D&E ?The procedure and the risks and benefits of dilation and curettage/evacuation have been explained to the patient.  The specific risks of bleeding, infection, anesthesia, uterine perforation, and damage to bowel or bladder  have been specifically discussed.  I have answered all of her questions and I believe that she has an adequate and informed understanding of this procedure. ? ?I spent 33 minutes involved in the  care of this patient preparing to see the patient by obtaining and reviewing her medical history (including labs, imaging tests and prior procedures), documenting clinical information in the electronic health record (EHR), counseling and coordinating care plans, writing and sending prescriptions, ordering tests or procedures and in direct communicating with the patient and medical staff discussing pertinent items from her history and physical exam. ? ? ?Finis Bud, M.D. ?06/08/2021 ?1:32 PM ? ? ?

## 2021-06-08 NOTE — Op Note (Signed)
? ? ?  OPERATIVE NOTE ?06/08/2021 ?4:05 PM ? ?PRE-OPERATIVE DIAGNOSIS:  ?1) Incomplete Ab ? ?POST-OPERATIVE DIAGNOSIS:  ?Same ? ?OPERATION:  D&E ? ?SURGEON(S): Surgeon(s) and Role: ?   Harlin Heys, MD - Primary  ? ?ANESTHESIA: General ? ?ESTIMATED BLOOD LOSS: 20 mL ? ?OPERATIVE FINDINGS: Uterine contents ? ?SPECIMEN:  ?ID Type Source Tests Collected by Time Destination  ?1 : retained products of conception Products of Conception [facility] [place] Harlin Heys, MD 06/08/2021 [id number]   ? ? ?COMPLICATIONS: None ? ?DRAINS: Foley to gravity ? ?DISPOSITION: Stable to recovery room ? ?DESCRIPTION OF PROCEDURE: ?     The patient was prepped and draped in the dorsal lithotomy position and placed under general anesthesia. Her cervix was grasped with a Jacob's tenaculum. Respecting the position and curvature of her cervix, it was dilated to accommodate a number 8 suction curette. The suction curette was placed within the endometrial cavity and a pressure greater than 65 mmHg was allowed to build. A systematic curettage was performed in all quadrants until no additional tissue was noted. The uterus became firm and globular. Pitocin was run in the IV. The tenaculum was removed from the cervix and hemostasis was noted. The weighted speculum was removed and the patient went to recovery room in stable condition. ? ?Harlin Heys, MD ?[address] ?[address] ?[place] [address] ?[phone] ? ?Follow up in 2 week(s) ? ? ? ?Finis Bud, M.D. ?06/08/2021 ?4:05 PM  ?

## 2021-06-09 NOTE — Anesthesia Postprocedure Evaluation (Signed)
Anesthesia Post Note ? ?Patient: Davonna Ertl ? ?Procedure(s) Performed: DILATATION AND EVACUATION (Uterus) ? ?Patient location during evaluation: PACU ?Anesthesia Type: General ?Level of consciousness: awake and alert ?Pain management: pain level controlled ?Vital Signs Assessment: post-procedure vital signs reviewed and stable ?Respiratory status: spontaneous breathing, nonlabored ventilation, respiratory function stable and patient connected to nasal cannula oxygen ?Cardiovascular status: blood pressure returned to baseline and stable ?Postop Assessment: no apparent nausea or vomiting ?Anesthetic complications: no ? ? ?No notable events documented. ? ? ?Last Vitals:  ?Vitals:  ? 06/08/21 1640 06/08/21 1651  ?BP: 95/66 104/70  ?Pulse: 65 76  ?Resp: 10 14  ?Temp:  (!) 36.3 ?C  ?SpO2: 99% 96%  ?  ?Last Pain:  ?Vitals:  ? 06/08/21 1651  ?TempSrc: Temporal  ?PainSc: 0-No pain  ? ? ?  ?  ?  ?  ?  ?  ? ?Cleda Mccreedy Belma Dyches ? ? ? ? ?

## 2021-06-10 ENCOUNTER — Encounter: Payer: Self-pay | Admitting: Obstetrics and Gynecology

## 2021-06-12 LAB — SURGICAL PATHOLOGY

## 2021-06-22 ENCOUNTER — Encounter: Payer: Self-pay | Admitting: Obstetrics and Gynecology

## 2021-06-22 ENCOUNTER — Ambulatory Visit (INDEPENDENT_AMBULATORY_CARE_PROVIDER_SITE_OTHER): Payer: Self-pay | Admitting: Obstetrics and Gynecology

## 2021-06-22 VITALS — BP 104/71 | HR 79 | Ht 62.0 in | Wt 143.2 lb

## 2021-06-22 DIAGNOSIS — Z9889 Other specified postprocedural states: Secondary | ICD-10-CM

## 2021-06-22 NOTE — Progress Notes (Signed)
Patient presents for 2 week follow-up post d&e. She states no bleeding but does has occasional pain that comes and goes, taking pain medication as needed. Patient states she would like to discuss when she may start using the nuvaring again. Patient states no other questions or concerns at this time.  ?

## 2021-06-22 NOTE — Progress Notes (Signed)
HPI: ?     [name] is a 36 y.o. [id number] who LMP was Patient's last menstrual period was 03/31/2021 (approximate). ? ?Subjective:  ? ?She presents today 2 weeks after miscarriage and D&E for incomplete.  She states her bleeding has stopped.  She has occasional cramping but it is not too bad and is controlled with medication.  She is interested in restarting her NuvaRing. ? ?  Hx: ?The following portions of the patient's history were reviewed and updated as appropriate: ?            She  has a past medical history of Allergy, Anemia, Breast mass, History of miscarriage, Miscarriage, and Tubal pregnancy. ?She does not have any pertinent problems on file. ?She  has a past surgical history that includes Dilation and evacuation (N/A, 06/08/2021). ?Her family history includes Cancer in her paternal grandmother; Thyroid disease in her maternal grandmother and mother; Urolithiasis in her paternal grandmother. ?She  reports that she has never smoked. She has never used smokeless tobacco. She reports current alcohol use. She reports that she does not use drugs. ?She has a current medication list which includes the following prescription(s): etonogestrel-ethinyl estradiol and hydrocodone-acetaminophen. ?She has No Known Allergies. ?      ?Review of Systems:  ?Review of Systems ? ?Constitutional: Denied constitutional symptoms, night sweats, recent illness, fatigue, fever, insomnia and weight loss.  ?Eyes: Denied eye symptoms, eye pain, photophobia, vision change and visual disturbance.  ?Ears/Nose/Throat/Neck: Denied ear, nose, throat or neck symptoms, hearing loss, nasal discharge, sinus congestion and sore throat.  ?Cardiovascular: Denied cardiovascular symptoms, arrhythmia, chest pain/pressure, edema, exercise intolerance, orthopnea and palpitations.  ?Respiratory: Denied pulmonary symptoms, asthma, pleuritic pain, productive sputum, cough, dyspnea and wheezing.  ?Gastrointestinal: Denied, gastro-esophageal  reflux, melena, nausea and vomiting.  ?Genitourinary: Denied genitourinary symptoms including symptomatic vaginal discharge, pelvic relaxation issues, and urinary complaints.  ?Musculoskeletal: Denied musculoskeletal symptoms, stiffness, swelling, muscle weakness and myalgia.  ?Dermatologic: Denied dermatology symptoms, rash and scar.  ?Neurologic: Denied neurology symptoms, dizziness, headache, neck pain and syncope.  ?Psychiatric: Denied psychiatric symptoms, anxiety and depression.  ?Endocrine: Denied endocrine symptoms including hot flashes and night sweats.  ? ?Meds: ?  ?Current Outpatient Medications on File Prior to Visit  ?Medication Sig Dispense Refill  ? etonogestrel-ethinyl estradiol (NUVARING) 0.12-0.015 MG/24HR vaginal ring Insert vaginally and leave in place for 3 consecutive weeks, then remove for 1 week. 1 each 12  ? HYDROcodone-acetaminophen (NORCO/VICODIN) 5-325 MG tablet Take 1-2 tablets by mouth every 6 (six) hours as needed for moderate pain. 15 tablet 0  ? ?No current facility-administered medications on file prior to visit.  ? ? ? ? ?Objective:  ?  ? ?Vitals:  ? 06/22/21 1101  ?BP: 104/71  ?Pulse: 79  ? ?Filed Weights  ? 06/22/21 1101  ?Weight: 143 lb 3.2 oz (65 kg)  ? ?  ?          ?        ? ?Assessment:  ?  ?[id number] ?Patient Active Problem List  ? Diagnosis Date Noted  ? Overweight (BMI 25.0-29.9) 04/21/2016  ? History of abnormal cervical Pap smear 04/21/2016  ? Urinary frequency 01/18/2015  ? Microscopic hematuria 01/18/2015  ? Nocturia 01/18/2015  ? SUI (stress urinary incontinence, female) 01/18/2015  ? ?  ?1. Post-operative state   ? ? Patient doing very well postop. ? Desires restart of birth control ? ? ?Plan:  ?  ?       ?  1.  I have recommended that she restart her NuvaRing 3 to 4 weeks after her miscarriage.  She understands this. ? She has no further restrictions at this time.  May resume normal activities. ?Orders ?No orders of the defined types were placed in this  encounter. ? ? No orders of the defined types were placed in this encounter. ?  ?  F/U ? Return in about 3 months (around 09/21/2021). ? ?Finis Bud, M.D. ?06/22/2021 ?11:14 AM ? ? ? ? ?

## 2021-07-02 IMAGING — US US ABDOMEN LIMITED RUQ/ASCITES
1 series · 14 of 25 positions shown · non-contrast
Comparison: Abdominal radiograph 5369 hours. CT Abdomen and Pelvis
02/22/2016.

CLINICAL DATA: 35-year-old female with epigastric pain.

EXAM:
ULTRASOUND ABDOMEN LIMITED RIGHT UPPER QUADRANT

[Series 1: us abdomen limited ruq (liver/gb) · 14 of 40 slices shown]
[im 1/40]
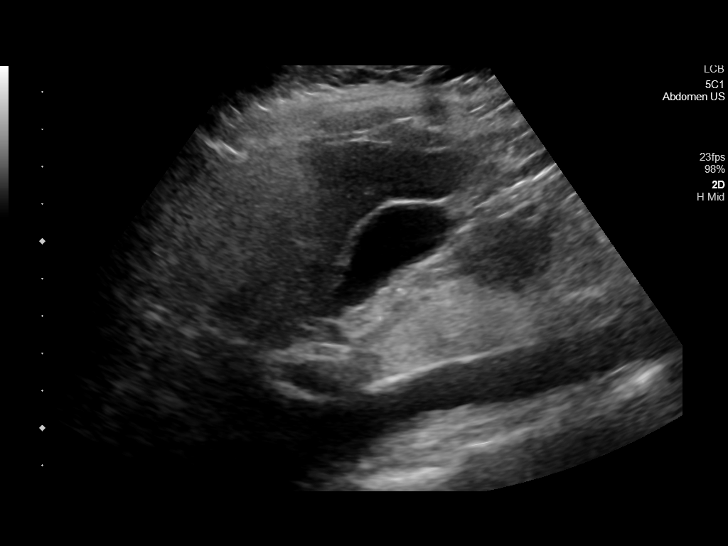
[im 4/40]
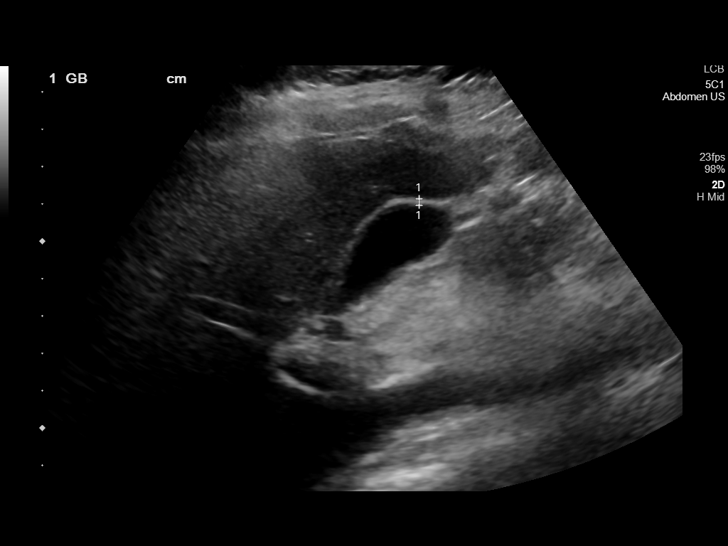
[im 7/40]
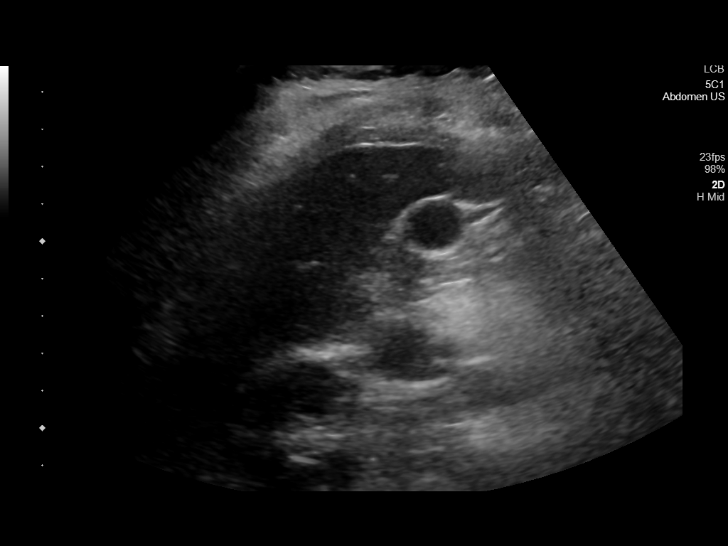
[im 10/40]
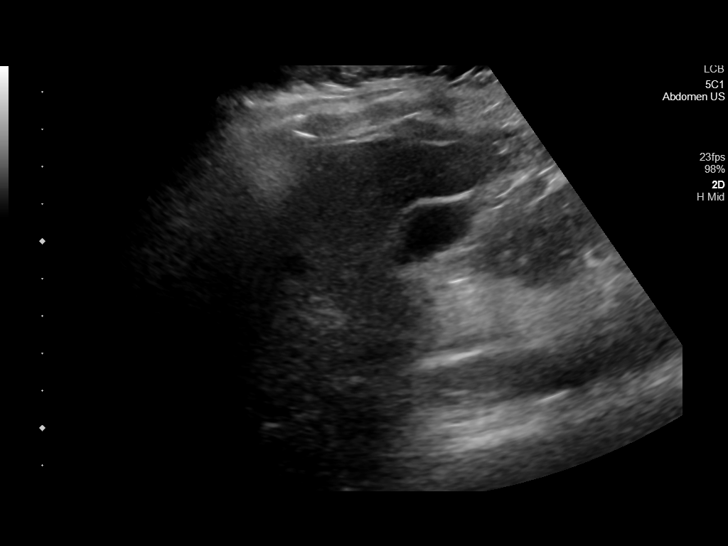
[im 14/40]
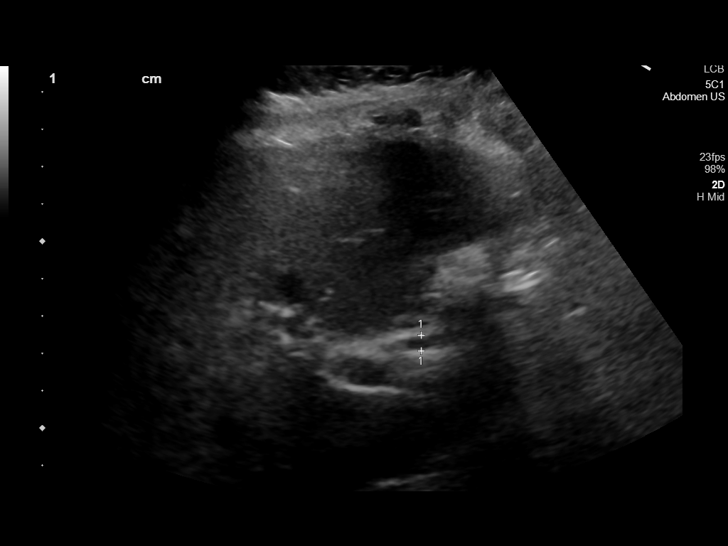
[im 15/40]
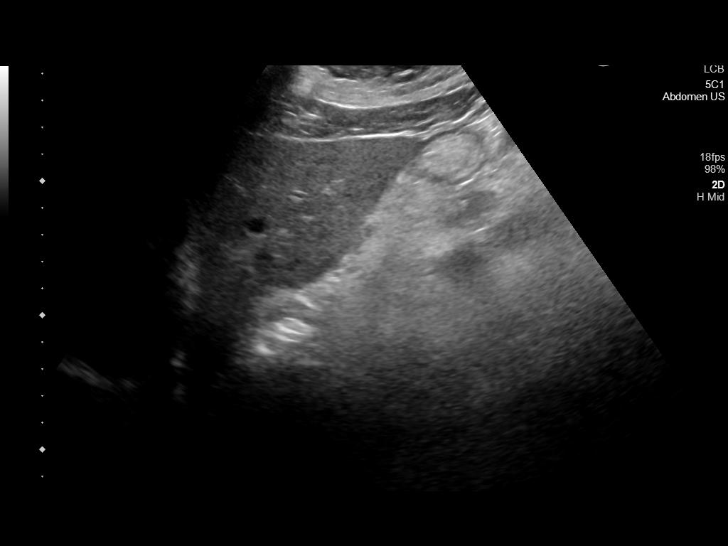
[im 18/40]
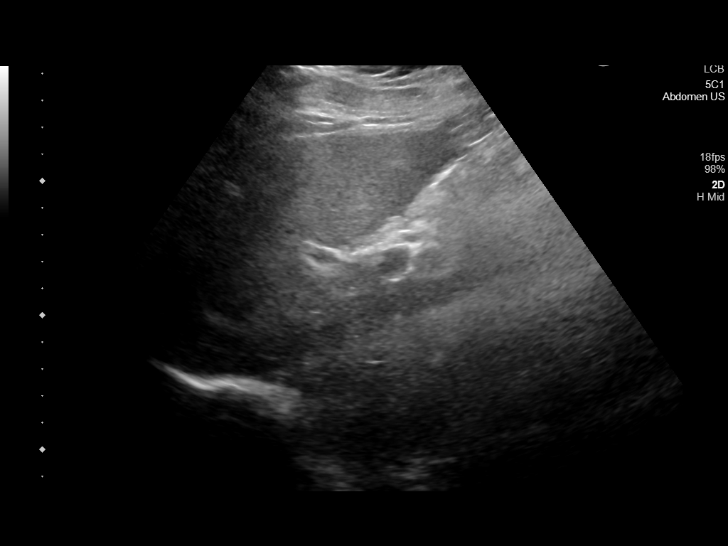
[im 22/40]
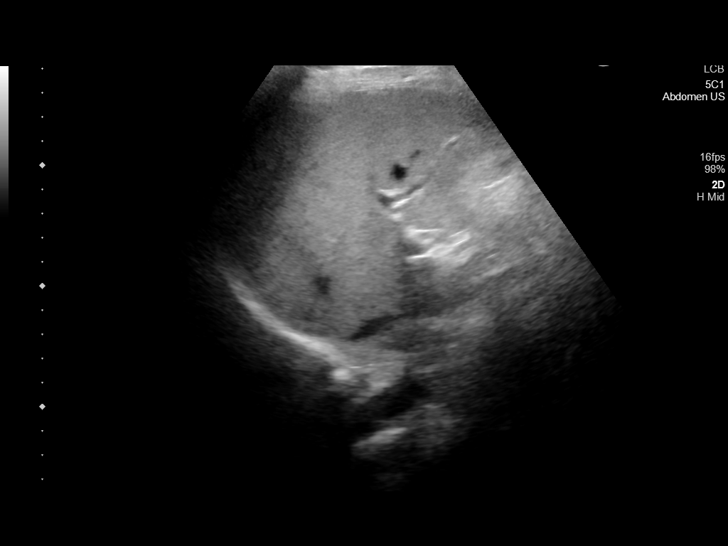
[im 25/40]
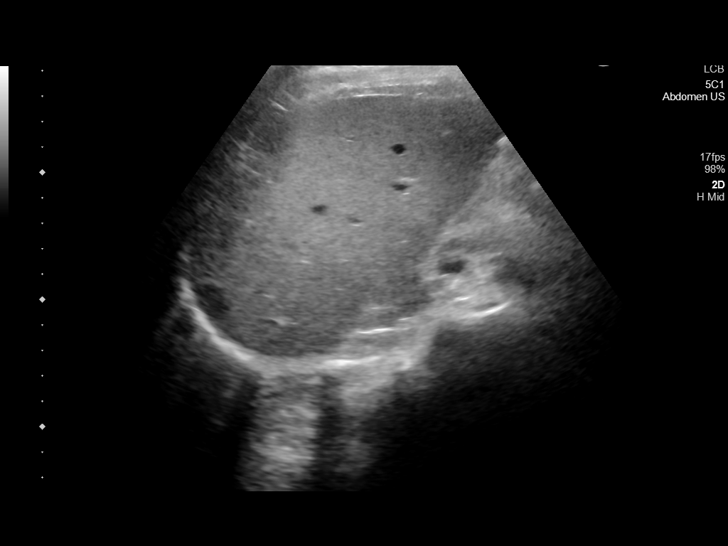
[im 27/40]
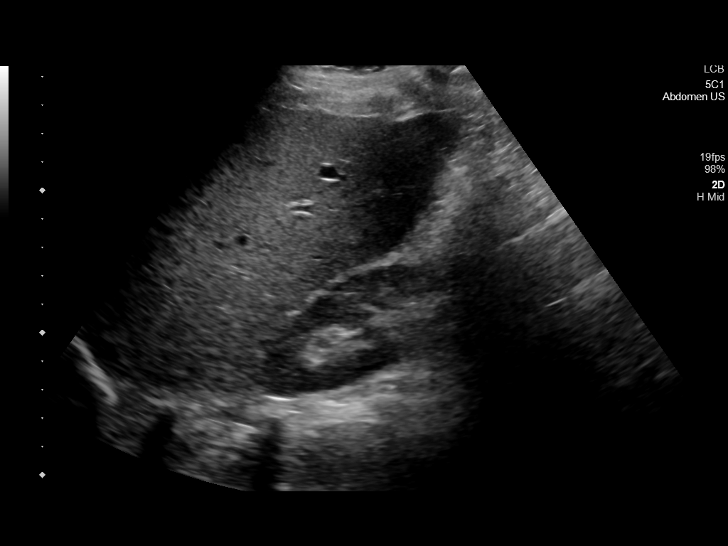
[im 30/40]
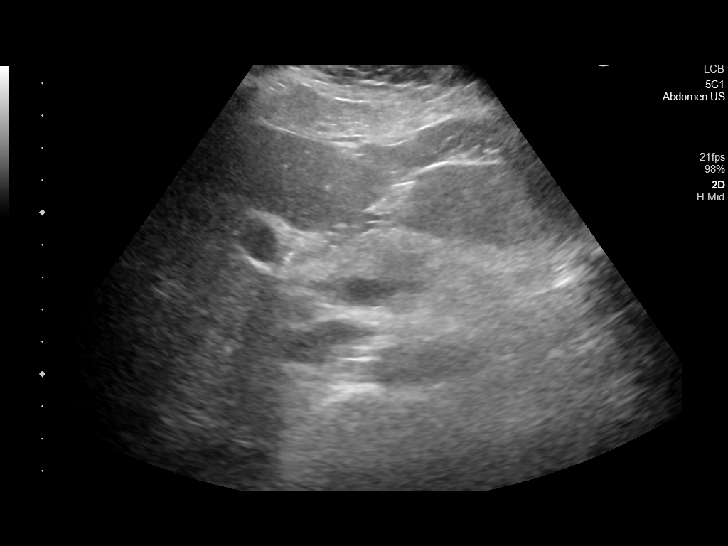
[im 33/40]
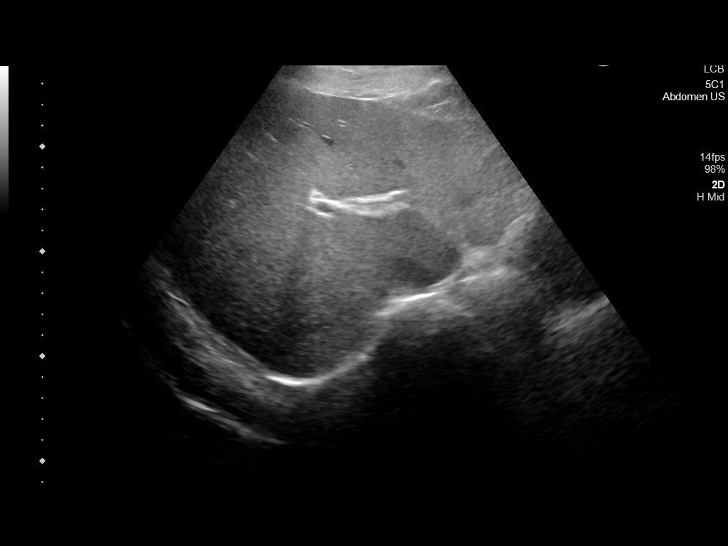
[im 36/40]
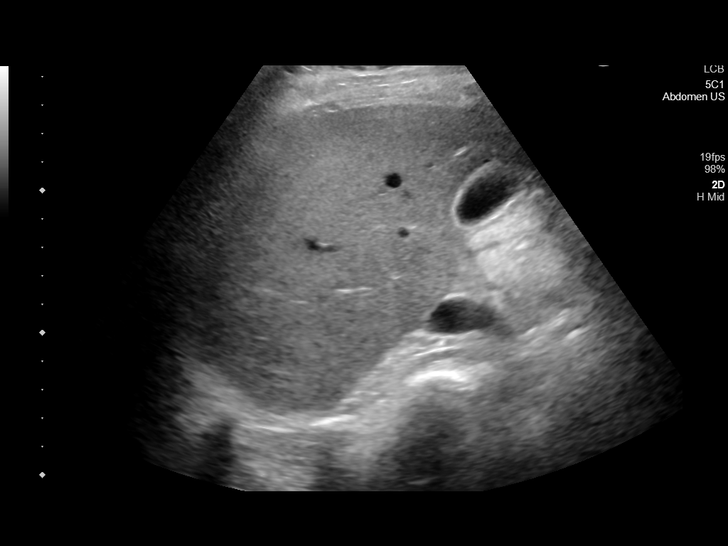
[im 40/40]
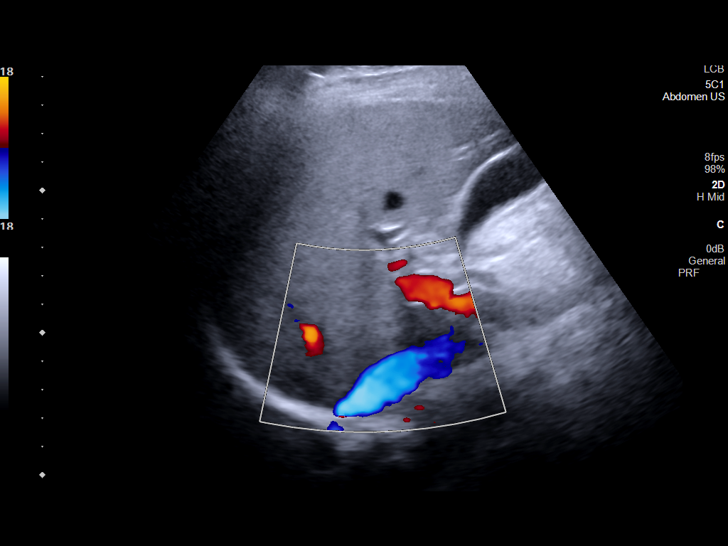

[14 of 25 positions shown; findings below may reference images not displayed]

FINDINGS: Gallbladder:

No gallstones or wall thickening visualized. No sonographic Murphy
sign noted by sonographer. No pericholecystic fluid.

Common bile duct:

Diameter: 4 mm, normal.

Liver:

Liver echogenicity at the upper limits of normal (image 27). No
discrete liver lesion. No intrahepatic biliary ductal dilatation.
Portal vein is patent on color Doppler imaging with normal direction
of blood flow towards the liver.

Other: Negative visible right kidney.
IMPRESSION: Negative right upper quadrant ultrasound.

## 2021-08-29 ENCOUNTER — Encounter: Payer: Self-pay | Admitting: Obstetrics and Gynecology

## 2021-08-29 ENCOUNTER — Telehealth: Payer: Self-pay | Admitting: Obstetrics and Gynecology

## 2021-09-21 ENCOUNTER — Encounter: Payer: Self-pay | Admitting: Obstetrics and Gynecology

## 2021-09-26 ENCOUNTER — Encounter: Payer: Self-pay | Admitting: Obstetrics and Gynecology

## 2021-10-04 ENCOUNTER — Encounter: Payer: Self-pay | Admitting: Obstetrics and Gynecology

## 2021-10-04 ENCOUNTER — Ambulatory Visit: Payer: 59 | Admitting: Obstetrics and Gynecology

## 2021-10-04 VITALS — BP 104/71 | HR 61 | Ht 62.0 in | Wt 147.4 lb

## 2021-10-04 DIAGNOSIS — Z3041 Encounter for surveillance of contraceptive pills: Secondary | ICD-10-CM

## 2021-10-04 DIAGNOSIS — Z9889 Other specified postprocedural states: Secondary | ICD-10-CM | POA: Diagnosis not present

## 2021-10-04 NOTE — Progress Notes (Addendum)
HPI:      [name] is a 36 y.o. [id number] who LMP was Patient's last menstrual period was 09/24/2021.  Subjective:   She presents today 4 months after miscarriage.  She has been using NuvaRing successfully.  Having normal regular cycles.  She is due for her annual and Pap but prefers female providers.    Hx: The following portions of the patient's history were reviewed and updated as appropriate:             She  has a past medical history of Allergy, Anemia, Breast mass, History of miscarriage, Miscarriage, and Tubal pregnancy. She does not have any pertinent problems on file. She  has a past surgical history that includes Dilation and evacuation (N/A, 06/08/2021). Her family history includes Cancer in her paternal grandmother; Thyroid disease in her maternal grandmother and mother; Urolithiasis in her paternal grandmother. She  reports that she has never smoked. She has never used smokeless tobacco. She reports current alcohol use. She reports that she does not use drugs. She has a current medication list which includes the following prescription(s): etonogestrel-ethinyl estradiol. She has No Known Allergies.       Review of Systems:  Review of Systems  Constitutional: Denied constitutional symptoms, night sweats, recent illness, fatigue, fever, insomnia and weight loss.  Eyes: Denied eye symptoms, eye pain, photophobia, vision change and visual disturbance.  Ears/Nose/Throat/Neck: Denied ear, nose, throat or neck symptoms, hearing loss, nasal discharge, sinus congestion and sore throat.  Cardiovascular: Denied cardiovascular symptoms, arrhythmia, chest pain/pressure, edema, exercise intolerance, orthopnea and palpitations.  Respiratory: Denied pulmonary symptoms, asthma, pleuritic pain, productive sputum, cough, dyspnea and wheezing.  Gastrointestinal: Denied, gastro-esophageal reflux, melena, nausea and vomiting.  Genitourinary: Denied genitourinary symptoms including  symptomatic vaginal discharge, pelvic relaxation issues, and urinary complaints.  Musculoskeletal: Denied musculoskeletal symptoms, stiffness, swelling, muscle weakness and myalgia.  Dermatologic: Denied dermatology symptoms, rash and scar.  Neurologic: Denied neurology symptoms, dizziness, headache, neck pain and syncope.  Psychiatric: Denied psychiatric symptoms, anxiety and depression.  Endocrine: Denied endocrine symptoms including hot flashes and night sweats.   Meds:   Current Outpatient Medications on File Prior to Visit  Medication Sig Dispense Refill   etonogestrel-ethinyl estradiol (NUVARING) 0.12-0.015 MG/24HR vaginal ring Insert vaginally and leave in place for 3 consecutive weeks, then remove for 1 week. 1 each 12   No current facility-administered medications on file prior to visit.      Objective:     Vitals:   10/04/21 1433  BP: 104/71  Pulse: 61   Filed Weights   10/04/21 1433  Weight: 147 lb 6.4 oz (66.9 kg)                        Assessment:    [id number] Patient Active Problem List   Diagnosis Date Noted   Overweight (BMI 25.0-29.9) 04/21/2016   History of abnormal cervical Pap smear 04/21/2016   Urinary frequency 01/18/2015   Microscopic hematuria 01/18/2015   Nocturia 01/18/2015   SUI (stress urinary incontinence, female) 01/18/2015     1. Post-operative state   2. Encounter for birth control pills maintenance     NuvaRing follow-up   Plan:            1.  Patient doing well on NuvaRing would like to continue.  2.  Has no plans for pregnancy in the near future.  3.  Needs annual exam and Pap smear but prefers female providers. Orders  No orders of the defined types were placed in this encounter.   No orders of the defined types were placed in this encounter.     F/U  Return in about 4 weeks (around 11/01/2021). I spent 18 minutes involved in the care of this patient preparing to see the patient by obtaining and reviewing her medical  history (including labs, imaging tests and prior procedures), documenting clinical information in the electronic health record (EHR), counseling and coordinating care plans, writing and sending prescriptions, ordering tests or procedures and in direct communicating with the patient and medical staff discussing pertinent items from her history and physical exam.  Elonda Husky, M.D. 10/04/2021 3:03 PM

## 2021-10-04 NOTE — Progress Notes (Signed)
Patient presents today for 3 month follow-up after miscarriage. She states use of nuvaring is going well. Patient complains of cramping off cycle that is mild to moderate. No other concerns.

## 2022-01-04 ENCOUNTER — Ambulatory Visit: Payer: Self-pay | Admitting: Obstetrics and Gynecology

## 2022-02-06 ENCOUNTER — Ambulatory Visit (INDEPENDENT_AMBULATORY_CARE_PROVIDER_SITE_OTHER): Payer: 59 | Admitting: Advanced Practice Midwife

## 2022-02-06 ENCOUNTER — Other Ambulatory Visit (HOSPITAL_COMMUNITY)
Admission: RE | Admit: 2022-02-06 | Discharge: 2022-02-06 | Disposition: A | Discharge status: Home / Self Care | Payer: 59 | Source: Ambulatory Visit | Attending: Obstetrics and Gynecology | Admitting: Obstetrics and Gynecology

## 2022-02-06 ENCOUNTER — Encounter: Payer: Self-pay | Admitting: Advanced Practice Midwife

## 2022-02-06 VITALS — BP 109/79 | HR 76 | Ht 62.0 in | Wt 146.0 lb

## 2022-02-06 DIAGNOSIS — Z113 Encounter for screening for infections with a predominantly sexual mode of transmission: Secondary | ICD-10-CM | POA: Diagnosis not present

## 2022-02-06 DIAGNOSIS — Z1159 Encounter for screening for other viral diseases: Secondary | ICD-10-CM | POA: Diagnosis not present

## 2022-02-06 DIAGNOSIS — Z13220 Encounter for screening for lipoid disorders: Secondary | ICD-10-CM

## 2022-02-06 DIAGNOSIS — Z131 Encounter for screening for diabetes mellitus: Secondary | ICD-10-CM

## 2022-02-06 DIAGNOSIS — Z01419 Encounter for gynecological examination (general) (routine) without abnormal findings: Secondary | ICD-10-CM

## 2022-02-06 DIAGNOSIS — Z124 Encounter for screening for malignant neoplasm of cervix: Secondary | ICD-10-CM

## 2022-02-06 DIAGNOSIS — Z01411 Encounter for gynecological examination (general) (routine) with abnormal findings: Secondary | ICD-10-CM

## 2022-02-06 DIAGNOSIS — Z30018 Encounter for initial prescription of other contraceptives: Secondary | ICD-10-CM

## 2022-02-06 DIAGNOSIS — N3941 Urge incontinence: Secondary | ICD-10-CM

## 2022-02-06 DIAGNOSIS — N393 Stress incontinence (female) (male): Secondary | ICD-10-CM

## 2022-02-06 MED ORDER — ETONOGESTREL-ETHINYL ESTRADIOL 0.12-0.015 MG/24HR VA RING: VAGINAL_RING | VAGINAL | 4 refills | Status: DC

## 2022-02-06 NOTE — Progress Notes (Signed)
Gynecology Annual Exam   PCP: [facility]  Chief Complaint:  Chief Complaint  Patient presents with   Annual Exam  Interpreter present for visit  History of Present Illness: Patient is a 36 y.o. [id number] presents for annual exam. The patient has complaint today of frequent headaches. She has tried tylenol and excedrine without much relief. The headaches are not associated with her cycles. She admits not sleeping well. We discussed triggers. She will try comfort measures that we reviewed and may want referral for neurology if headaches persist. She also mentions urinary incontinence for approximately the past 7 years. We discussed treatment options. She requests referral for pelvic floor physical therapy.  LMP: Patient's last menstrual period was 01/24/2022. Average Interval: regular, 28 days Duration of flow:  4-5  days Heavy Menses: no Clots: no Intermenstrual Bleeding: no Postcoital Bleeding: no Dysmenorrhea: no  The patient is sexually active. She currently uses NuvaRing vaginal inserts for contraception. She denies dyspareunia.  The patient does perform self breast exams.  There is no notable family history of breast or ovarian cancer in her family.  The patient wears seatbelts: yes.   The patient has regular exercise: She is active with her children. She does not pay particular attention to eating healthy foods; she "eats everything". She admits hydration primarily with water. Her sleep is interrupted due to toddler.  The patient denies current symptoms of depression.    Review of Systems: Review of Systems  Constitutional:  Negative for chills and fever.  HENT:  Negative for congestion, ear discharge, ear pain, hearing loss, sinus pain and sore throat.   Eyes:  Negative for blurred vision and double vision.  Respiratory:  Negative for cough, shortness of breath and wheezing.   Cardiovascular:  Negative for chest pain, palpitations and leg swelling.   Gastrointestinal:  Negative for abdominal pain, blood in stool, constipation, diarrhea, heartburn, melena, nausea and vomiting.  Genitourinary:  Positive for urgency. Negative for dysuria, flank pain, frequency and hematuria.  Musculoskeletal:  Negative for back pain, joint pain and myalgias.  Skin:  Negative for itching and rash.  Neurological:  Positive for headaches. Negative for dizziness, tingling, tremors, sensory change, speech change, focal weakness, seizures, loss of consciousness and weakness.  Endo/Heme/Allergies:  Negative for environmental allergies. Does not bruise/bleed easily.  Psychiatric/Behavioral:  Negative for depression, hallucinations, memory loss, substance abuse and suicidal ideas. The patient is not nervous/anxious and does not have insomnia.     Past Medical History:  Patient Active Problem List   Diagnosis Date Noted   Overweight (BMI 25.0-29.9) 04/21/2016   History of abnormal cervical Pap smear 04/21/2016   Urinary frequency 01/18/2015   Microscopic hematuria 01/18/2015   Nocturia 01/18/2015   SUI (stress urinary incontinence, female) 01/18/2015    Past Surgical History:  Past Surgical History:  Procedure Laterality Date   DILATION AND EVACUATION N/A 06/08/2021   Procedure: DILATATION AND EVACUATION;  Surgeon: Linzie Collin, MD;  Location: [facility] ORS;  Service: Gynecology;  Laterality: N/A;    Gynecologic History:  Patient's last menstrual period was 01/24/2022. Contraception: NuvaRing vaginal inserts Last Pap: 2018 Results were: no abnormalities   Obstetric History: [id number]  Family History:  Family History  Problem Relation Age of Onset   Cancer Paternal Grandmother        uterine cancer   Urolithiasis Paternal Grandmother    Thyroid disease Mother    Thyroid disease Maternal Grandmother    Kidney cancer Neg Hx  Kidney disease Neg Hx    Prostate cancer Neg Hx     Social History:  Social History   Socioeconomic History   Marital  status: Married    Spouse name: Not on file   Number of children: Not on file   Years of education: Not on file   Highest education level: Not on file  Occupational History   Not on file  Tobacco Use   Smoking status: Never   Smokeless tobacco: Never  Vaping Use   Vaping Use: Never used  Substance and Sexual Activity   Alcohol use: Yes    Comment: Socially    Drug use: No   Sexual activity: Yes    Birth control/protection: Inserts  Other Topics Concern   Not on file  Social History Narrative   ** Merged History Encounter **       ** Merged History Encounter **       Social Determinants of Health   Financial Resource Strain: Not on file  Food Insecurity: Not on file  Transportation Needs: Not on file  Physical Activity: Not on file  Stress: Not on file  Social Connections: Not on file  Intimate Partner Violence: Not on file    Allergies:  No Known Allergies  Medications: Prior to Admission medications   Medication Sig Start Date End Date Taking? Authorizing Provider  etonogestrel-ethinyl estradiol (NUVARING) 0.12-0.015 MG/24HR vaginal ring Insert vaginally and leave in place for 3 consecutive weeks, then remove for 1 week. 02/06/22   Tresea Mall, CNM    Physical Exam Vitals: Blood pressure 109/79, pulse 76, height 5\' 2"  (1.575 m), weight 146 lb (66.2 kg), last menstrual period 01/24/2022  General: NAD HEENT: normocephalic, anicteric Thyroid: no enlargement, no palpable nodules Pulmonary: No increased work of breathing, CTAB Cardiovascular: RRR, distal pulses 2+ Breast: Breast symmetrical, no tenderness, no palpable nodules or masses, no skin or nipple retraction present, no nipple discharge.  No axillary or supraclavicular lymphadenopathy. Abdomen: NABS, soft, non-tender, non-distended.  Umbilicus without lesions.  No hepatomegaly, splenomegaly or masses palpable. No evidence of hernia  Genitourinary:  External: Normal external female genitalia.  Normal  urethral meatus, normal Bartholin's and Skene's glands.    Vagina: Normal vaginal mucosa, some prolapse of uterus noted with bearing down/cough, minimal to moderate muscle tone  Cervix: Grossly normal in appearance, friable  Uterus: Non-enlarged, mobile, normal contour.  No CMT  Adnexa: ovaries non-enlarged, no adnexal masses  Rectal: deferred  Lymphatic: no evidence of inguinal lymphadenopathy Extremities: no edema, erythema, or tenderness Neurologic: Grossly intact Psychiatric: mood appropriate, affect full   Assessment: 36 y.o. 20 routine annual exam, mild uterine prolapse  Plan: Problem List Items Addressed This Visit       Other   SUI (stress urinary incontinence, female)   Relevant Orders   Ambulatory referral to Physical Therapy   Other Visit Diagnoses     Well woman exam with routine gynecological exam    -  Primary   Relevant Orders   Hgb A1c w/o eAG   Lipid Panel With LDL/HDL Ratio   CBC with Differential/Platelet   Comprehensive metabolic panel   HIV Antibody (routine testing w rflx)   Hepatitis B surface antibody,qualitative   Hepatitis C antibody   RPR Qual   Cytology - PAP   Urge incontinence of urine       Relevant Orders   Ambulatory referral to Physical Therapy   Screen for sexually transmitted diseases       Relevant Orders  HIV Antibody (routine testing w rflx)   Hepatitis B surface antibody,qualitative   Hepatitis C antibody   RPR Qual   Cytology - PAP   Need for hepatitis C screening test       Relevant Orders   Hepatitis C antibody   Need for hepatitis B screening test       Relevant Orders   Hepatitis B surface antibody,qualitative   Screening for diabetes mellitus       Relevant Orders   Hgb A1c w/o eAG   Lipid screening       Relevant Orders   Lipid Panel With LDL/HDL Ratio   Screening for cervical cancer       Relevant Orders   Cytology - PAP   Encounter for gynecological examination with abnormal finding       Relevant  Medications   etonogestrel-ethinyl estradiol (NUVARING) 0.12-0.015 MG/24HR vaginal ring   Encounter for prescription for nuvaring       Relevant Medications   etonogestrel-ethinyl estradiol (NUVARING) 0.12-0.015 MG/24HR vaginal ring       1) STI screening  was offered and accepted  2)  ASCCP guidelines and rationale discussed.  Patient opts for every 5 years screening interval  3) Contraception - the patient is currently using  NuvaRing vaginal inserts.  She is happy with her current form of contraception and plans to continue  4) Routine healthcare maintenance including cholesterol, diabetes screening discussed Ordered today  5) Return in about 1 year (around 02/07/2023) for annual established gyn.   Tresea Mall, CNM [facility] 02/06/2022, 4:52 PM

## 2022-02-06 NOTE — Patient Instructions (Addendum)
Headache:  Adequate sleep Hydration; drink enough water that urine is clear to light yellow Over the counter Magnesium supplement 250 to 400 mg daily Caffeine Fresh air Take breaks from screens (computers, etc)  Pelvic Floor Dysfunction, Female  Pelvic floor dysfunction (PFD) is a condition that results when the group of muscles and connective tissues that support the organs in the pelvis (pelvic floor muscles) do not work well. These muscles and their connections form a sling that supports the colon and bladder. In women, they also support the uterus. PFD causes pelvic floor muscles to be too weak, too tight, or both. In PFD, muscle movements are not coordinated. This may cause bowel or bladder problems. It may also cause pain. What are the causes? This condition may be caused by an injury to the pelvic area or by a weakening of pelvic muscles. This often results from pregnancy and childbirth or other types of strain. In many cases, the exact cause is not known. What increases the risk? The following factors may make you more likely to develop this condition: Having chronic bladder tissue inflammation (interstitial cystitis). Being an older person. Being overweight. History of radiation treatment for cancer in the pelvic region. Previous pelvic surgery, such as removal of the uterus (hysterectomy). What are the signs or symptoms? Symptoms of this condition vary and may include: Bladder symptoms, such as: Trouble starting urination and emptying the bladder. Frequent urinary tract infections. Leaking urine when coughing, laughing, or exercising (stress incontinence). Having to pass urine urgently or frequently. Pain when passing urine. Bowel symptoms, such as: Constipation. Urgent or frequent bowel movements. Incomplete bowel movements. Painful bowel movements. Leaking stool or gas. Unexplained genital or rectal pain. Genital or rectal muscle spasms. Low back pain. Other symptoms  may include: A heavy, full, or aching feeling in the vagina. A bulge that protrudes into the vagina. Pain during or after sex. How is this diagnosed? This condition may be diagnosed based on: Your symptoms and medical history. A physical exam. During the exam, your health care provider may check your pelvic muscles for tightness, spasm, pain, or weakness. This may include a rectal exam and a pelvic exam. In some cases, you may have diagnostic tests, such as: Electrical muscle function tests. Urine flow testing. X-ray tests of bowel function. Ultrasound of the pelvic organs. How is this treated? Treatment for this condition depends on the symptoms. Treatment options include: Physical therapy. This may include Kegel exercises to help relax or strengthen the pelvic floor muscles. Biofeedback. This type of therapy provides feedback on how tight your pelvic floor muscles are so that you can learn to control them. Internal or external massage therapy. A treatment that involves electrical stimulation of the pelvic floor muscles to help control pain (transcutaneous electrical nerve stimulation, or TENS). Sound wave therapy (ultrasound) to reduce muscle spasms. Medicines, such as: Muscle relaxants. Bladder control medicines. Surgery to reconstruct or support pelvic floor muscles may be an option if other treatments do not help. Follow these instructions at home: Activity Do your usual activities as told by your health care provider. Ask your health care provider if you should modify any activities. Do pelvic floor strengthening or relaxing exercises at home as told by your physical therapist. Lifestyle Maintain a healthy weight. Eat foods that are high in fiber, such as beans, whole grains, and fresh fruits and vegetables. Limit foods that are high in fat and processed sugars, such as fried or sweet foods. Manage stress with relaxation techniques such  as yoga or meditation. General  instructions If you have problems with leakage: Use absorbable pads or wear padded underwear. Wash frequently with mild soap. Keep your genital and anal area as clean and dry as possible. Ask your health care provider if you should try a barrier cream to prevent skin irritation. Take warm baths to relieve pelvic muscle tension or spasms. Take over-the-counter and prescription medicines only as told by your health care provider. Keep all follow-up visits. How is this prevented? The cause of PFD is not always known, but there are a few things you can do to reduce the risk of developing this condition, including: Staying at a healthy weight. Getting regular exercise. Managing stress. Contact a health care provider if: Your symptoms are not improving with home care. You have signs or symptoms of PFD that get worse at home. You develop new signs or symptoms. You have signs of a urinary tract infection, such as: Fever. Chills. Increased urinary frequency. A burning feeling when urinating. You have not had a bowel movement in 3 days (constipation). Summary Pelvic floor dysfunction results when the muscles and connective tissues in your pelvic floor do not work well. These muscles and their connections form a sling that supports your colon and bladder. In women, they also support the uterus. PFD may be caused by an injury to the pelvic area or by a weakening of pelvic muscles. PFD causes pelvic floor muscles to be too weak, too tight, or a combination of both. Symptoms may vary from person to person. In most cases, PFD can be treated with physical therapies and medicines. Surgery may be an option if other treatments do not help. This information is not intended to replace advice given to you by your health care provider. Make sure you discuss any questions you have with your health care provider. Document Revised: 06/29/2020 Document Reviewed: 06/29/2020 Elsevier Patient Education  2023  [employer].

## 2022-02-07 LAB — HEPATITIS B SURFACE ANTIBODY,QUALITATIVE: Hep B Surface Ab, Qual: NONREACTIVE

## 2022-02-07 LAB — CBC WITH DIFFERENTIAL/PLATELET
Basophils Absolute: 0 10*3/uL (ref 0.0–0.2)
Basos: 1 %
EOS (ABSOLUTE): 0.1 10*3/uL (ref 0.0–0.4)
Eos: 1 %
Hematocrit: 40.1 % (ref 34.0–46.6)
Hemoglobin: 13.2 g/dL (ref 11.1–15.9)
Immature Grans (Abs): 0 10*3/uL (ref 0.0–0.1)
Immature Granulocytes: 0 %
Lymphocytes Absolute: 2.4 10*3/uL (ref 0.7–3.1)
Lymphs: 40 %
MCH: 29.8 pg (ref 26.6–33.0)
MCHC: 32.9 g/dL (ref 31.5–35.7)
MCV: 91 fL (ref 79–97)
Monocytes Absolute: 0.3 10*3/uL (ref 0.1–0.9)
Monocytes: 5 %
Neutrophils Absolute: 3.3 10*3/uL (ref 1.4–7.0)
Neutrophils: 53 %
Platelets: 272 10*3/uL (ref 150–450)
RBC: 4.43 x10E6/uL (ref 3.77–5.28)
RDW: 12.1 % (ref 11.7–15.4)
WBC: 6.1 10*3/uL (ref 3.4–10.8)

## 2022-02-07 LAB — LIPID PANEL WITH LDL/HDL RATIO
Cholesterol, Total: 156 mg/dL (ref 100–199)
HDL: 51 mg/dL (ref >39)
LDL Chol Calc (NIH): 75 mg/dL (ref 0–99)
LDL/HDL Ratio: 1.5 ratio (ref 0.0–3.2)
Triglycerides: 181 mg/dL — ABNORMAL HIGH (ref 0–149)
VLDL Cholesterol Cal: 30 mg/dL (ref 5–40)

## 2022-02-07 LAB — COMPREHENSIVE METABOLIC PANEL
ALT: 9 IU/L (ref 0–32)
AST: 12 IU/L (ref 0–40)
Albumin/Globulin Ratio: 1.9 (ref 1.2–2.2)
Albumin: 4.5 g/dL (ref 3.9–4.9)
Alkaline Phosphatase: 81 IU/L (ref 44–121)
BUN/Creatinine Ratio: 15 (ref 9–23)
BUN: 11 mg/dL (ref 6–20)
Bilirubin Total: 0.4 mg/dL (ref 0.0–1.2)
CO2: 22 mmol/L (ref 20–29)
Calcium: 9.4 mg/dL (ref 8.7–10.2)
Chloride: 103 mmol/L (ref 96–106)
Creatinine, Ser: 0.71 mg/dL (ref 0.57–1.00)
Globulin, Total: 2.4 g/dL (ref 1.5–4.5)
Glucose: 84 mg/dL (ref 70–99)
Potassium: 4.3 mmol/L (ref 3.5–5.2)
Sodium: 139 mmol/L (ref 134–144)
Total Protein: 6.9 g/dL (ref 6.0–8.5)
eGFR: 114 mL/min/{1.73_m2} (ref >59)

## 2022-02-07 LAB — RPR QUALITATIVE: RPR Ser Ql: NONREACTIVE

## 2022-02-07 LAB — HGB A1C W/O EAG: Hgb A1c MFr Bld: 5.5 % (ref 4.8–5.6)

## 2022-02-07 LAB — HEPATITIS C ANTIBODY: Hep C Virus Ab: NONREACTIVE

## 2022-02-07 LAB — HIV ANTIBODY (ROUTINE TESTING W REFLEX): HIV Screen 4th Generation wRfx: NONREACTIVE

## 2022-02-12 LAB — CYTOLOGY - PAP
Chlamydia: NEGATIVE
Comment: NEGATIVE
Comment: NEGATIVE
Comment: NEGATIVE
Comment: NORMAL
Diagnosis: NEGATIVE
High risk HPV: NEGATIVE
Neisseria Gonorrhea: NEGATIVE
Trichomonas: NEGATIVE

## 2022-08-06 ENCOUNTER — Emergency Department
Admission: EM | Admit: 2022-08-06 | Discharge: 2022-08-07 | Disposition: A | Discharge status: Home / Self Care | Payer: 59 | Attending: Emergency Medicine | Admitting: Emergency Medicine

## 2022-08-06 DIAGNOSIS — R1030 Lower abdominal pain, unspecified: Secondary | ICD-10-CM | POA: Diagnosis present

## 2022-08-06 DIAGNOSIS — N939 Abnormal uterine and vaginal bleeding, unspecified: Secondary | ICD-10-CM | POA: Diagnosis not present

## 2022-08-06 LAB — URINALYSIS, ROUTINE W REFLEX MICROSCOPIC
Bilirubin Urine: NEGATIVE
Glucose, UA: NEGATIVE mg/dL
Ketones, ur: NEGATIVE mg/dL
Leukocytes,Ua: NEGATIVE
Nitrite: NEGATIVE
Protein, ur: NEGATIVE mg/dL
Specific Gravity, Urine: 1.003 — ABNORMAL LOW (ref 1.005–1.030)
pH: 7 (ref 5.0–8.0)

## 2022-08-06 LAB — CBC
HCT: 39 % (ref 36.0–46.0)
Hemoglobin: 12.7 g/dL (ref 12.0–15.0)
MCH: 30.4 pg (ref 26.0–34.0)
MCHC: 32.6 g/dL (ref 30.0–36.0)
MCV: 93.3 fL (ref 80.0–100.0)
Platelets: 229 10*3/uL (ref 150–400)
RBC: 4.18 MIL/uL (ref 3.87–5.11)
RDW: 12.4 % (ref 11.5–15.5)
WBC: 6 10*3/uL (ref 4.0–10.5)
nRBC: 0 % (ref 0.0–0.2)

## 2022-08-06 LAB — COMPREHENSIVE METABOLIC PANEL
ALT: 13 U/L (ref 0–44)
AST: 18 U/L (ref 15–41)
Albumin: 4.1 g/dL (ref 3.5–5.0)
Alkaline Phosphatase: 51 U/L (ref 38–126)
Anion gap: 12 (ref 5–15)
BUN: 15 mg/dL (ref 6–20)
CO2: 19 mmol/L — ABNORMAL LOW (ref 22–32)
Calcium: 8.9 mg/dL (ref 8.9–10.3)
Chloride: 104 mmol/L (ref 98–111)
Creatinine, Ser: 0.67 mg/dL (ref 0.44–1.00)
GFR, Estimated: >60 mL/min (ref >60)
Glucose, Bld: 113 mg/dL — ABNORMAL HIGH (ref 70–99)
Potassium: 3.6 mmol/L (ref 3.5–5.1)
Sodium: 135 mmol/L (ref 135–145)
Total Bilirubin: 0.5 mg/dL (ref 0.3–1.2)
Total Protein: 6.9 g/dL (ref 6.5–8.1)

## 2022-08-06 LAB — POC URINE PREG, ED: Preg Test, Ur: NEGATIVE

## 2022-08-06 LAB — WET PREP, GENITAL
Clue Cells Wet Prep HPF POC: NONE SEEN
Sperm: NONE SEEN
Trich, Wet Prep: NONE SEEN
WBC, Wet Prep HPF POC: <10 (ref <10)
Yeast Wet Prep HPF POC: NONE SEEN

## 2022-08-06 LAB — LIPASE, BLOOD: Lipase: 36 U/L (ref 11–51)

## 2022-08-06 NOTE — ED Provider Notes (Signed)
[facility] Provider Note    Event Date/Time   First MD Initiated Contact with Patient 08/06/22 2145     (approximate)   History   Abdominal Pain  Formal translating service utilized HPI  Breanna Shelton is a 37 y.o. female no significant past medical history who presents to the emergency department for vaginal bleeding.  Patient states that she has had multiple episodes of vaginal bleeding over the past couple of days.  Had a couple of blood clots that she passed that she was concerned and came into the emergency department.  Similar episode happened last month.  States that her primary care physician told her that she needed to follow-up with gynecology.  Called today and was told to come into the emergency department for evaluation.  Denies any nausea or vomiting.  No dysuria, urinary urgency or frequency.  No significant vaginal discharge.  Denies any concern for an STI.  States that she had an abnormal Pap smear in 2016 in [place] and was concerned that she might have cancer.     Physical Exam   Triage Vital Signs: ED Triage Vitals [08/06/22 1909]  Enc Vitals Group     BP 124/81     Pulse Rate 62     Resp 18     Temp 98.8 F (37.1 C)     Temp src      SpO2 98 %     Weight 145 lb (65.8 kg)     Height 5\' 2"  (1.575 m)     Head Circumference      Peak Flow      Pain Score 6     Pain Loc      Pain Edu?      Excl. in GC?     Most recent vital signs: Vitals:   08/06/22 1909  BP: 124/81  Pulse: 62  Resp: 18  Temp: 98.8 F (37.1 C)  SpO2: 98%    Physical Exam Constitutional:      Appearance: She is well-developed.  HENT:     Head: Atraumatic.  Eyes:     Conjunctiva/sclera: Conjunctivae normal.  Cardiovascular:     Rate and Rhythm: Regular rhythm.  Pulmonary:     Effort: No respiratory distress.  Abdominal:     General: There is no distension.     Tenderness: There is abdominal tenderness in the suprapubic area. Negative  signs include Murphy's sign, Rovsing's sign, McBurney's sign and psoas sign.  Genitourinary:    Vagina: Normal.     Cervix: Normal.     Uterus: Normal.   Musculoskeletal:        General: Normal range of motion.     Cervical back: Normal range of motion.  Skin:    General: Skin is warm.  Neurological:     Mental Status: She is alert. Mental status is at baseline.      IMPRESSION / MDM / ASSESSMENT AND PLAN / ED COURSE  I reviewed the triage vital signs and the nursing notes.  Differential diagnosis including abnormal uterine bleeding, pregnancy, STI, UTI, malignancy    Labs (all labs ordered are listed, but only abnormal results are displayed) Labs interpreted as -    Labs Reviewed  COMPREHENSIVE METABOLIC PANEL - Abnormal; Notable for the following components:      Result Value   CO2 19 (*)    Glucose, Bld 113 (*)    All other components within normal limits  URINALYSIS, ROUTINE W  REFLEX MICROSCOPIC - Abnormal; Notable for the following components:   Color, Urine COLORLESS (*)    APPearance CLEAR (*)    Specific Gravity, Urine 1.003 (*)    Hgb urine dipstick SMALL (*)    Bacteria, UA RARE (*)    All other components within normal limits  WET PREP, GENITAL  CHLAMYDIA/NGC RT PCR ([facility] ONLY)            LIPASE, BLOOD  CBC  POC URINE PREG, ED    Pregnancy test is negative.  UA without signs of urinary tract infection.  No significant anemia on exam.  No CMT and no concern for PID.  Wet prep and GC and chlamydia obtained.  Clinical picture is not consistent with ovarian torsion.  Low suspicion for ovarian cyst given that she does not have severe abdominal pain and is not unilateral.  No concern for TOA.  Concern for abnormal uterine bleeding.  Discussed at length the need to follow-up closely with gynecology.  Given return precautions for any ongoing or worsening symptoms.  Discussed Tylenol Motrin for pain control.  Wet prep negative.  GC and chlamydia currently  pending.    PROCEDURES:  Critical Care performed: No  Procedures  Patient's presentation is most consistent with acute illness / injury with system symptoms.   MEDICATIONS ORDERED IN ED: Medications - No data to display  FINAL CLINICAL IMPRESSION(S) / ED DIAGNOSES   Final diagnoses:  Lower abdominal pain  Vaginal bleeding     Rx / DC Orders   ED Discharge Orders     None        Note:  This document was prepared using Dragon voice recognition software and may include unintentional dictation errors.   Corena Herter, MD 08/06/22 [phone]

## 2022-08-06 NOTE — ED Notes (Signed)
D/c instructions reviewed with the patient using spanish ipad interpreter. Pt verbalized understanding of d/c instructions and follow up care.

## 2022-08-06 NOTE — Discharge Instructions (Addendum)
Control de dolor: Ibuprofeno (motrin/aleve/advil): puede tomar de 3 a 4 tabletas (600 a 800 mg) cada 6 horas segn sea necesario para el dolor o la fiebre. Acetaminofn (tylenol): puede tomar 2 tabletas extrafuertes (1000 mg) cada 6 horas segn sea necesario para el dolor o la fiebre. Puede alternar estos medicamentos o tomarlos juntos. Asegrese de comer/beber agua mientras toma estos medicamentos. 

## 2022-08-06 NOTE — ED Triage Notes (Signed)
Pt c/o lower abd pain x4 days, pt reports she is also passing blood clots through her vagina. Pt states she uses BC but unsure if she could be pregnant. Pt denies n/v/d.

## 2022-08-07 ENCOUNTER — Telehealth: Payer: Self-pay | Admitting: Obstetrics and Gynecology

## 2022-08-07 LAB — CHLAMYDIA/NGC RT PCR (ARMC ONLY)
Chlamydia Tr: NOT DETECTED
N gonorrhoeae: NOT DETECTED

## 2022-08-07 NOTE — Telephone Encounter (Signed)
Patient's husband called requesting a appointment from an emergency room visit yesterday.  Would you please look at your scheduled to let me know where and when to schedule her?

## 2022-08-08 ENCOUNTER — Telehealth: Payer: Self-pay | Admitting: Certified Nurse Midwife

## 2022-08-08 NOTE — Telephone Encounter (Signed)
Called the patient to offer an appointment on 08/23/2022 at 10:55 am with Hartley Barefoot. Patient will need to call to confirm date and time

## 2022-08-23 ENCOUNTER — Encounter: Payer: Self-pay | Admitting: Certified Nurse Midwife

## 2022-08-23 ENCOUNTER — Ambulatory Visit: Payer: 59 | Admitting: Certified Nurse Midwife

## 2022-08-23 ENCOUNTER — Other Ambulatory Visit: Payer: Self-pay | Admitting: Certified Nurse Midwife

## 2022-08-23 VITALS — BP 111/78 | HR 68 | Ht 61.0 in | Wt 147.0 lb

## 2022-08-23 DIAGNOSIS — Z30018 Encounter for initial prescription of other contraceptives: Secondary | ICD-10-CM

## 2022-08-23 DIAGNOSIS — Z131 Encounter for screening for diabetes mellitus: Secondary | ICD-10-CM

## 2022-08-23 DIAGNOSIS — N946 Dysmenorrhea, unspecified: Secondary | ICD-10-CM

## 2022-08-23 DIAGNOSIS — K5900 Constipation, unspecified: Secondary | ICD-10-CM

## 2022-08-23 DIAGNOSIS — Z124 Encounter for screening for malignant neoplasm of cervix: Secondary | ICD-10-CM

## 2022-08-23 DIAGNOSIS — Z01411 Encounter for gynecological examination (general) (routine) with abnormal findings: Secondary | ICD-10-CM

## 2022-08-23 DIAGNOSIS — Z01419 Encounter for gynecological examination (general) (routine) without abnormal findings: Secondary | ICD-10-CM

## 2022-08-23 DIAGNOSIS — Z1151 Encounter for screening for human papillomavirus (HPV): Secondary | ICD-10-CM

## 2022-08-23 IMAGING — US US PELVIS COMPLETE TRANSABD/TRANSVAG W DUPLEX AND/OR DOPPLER
1 series · 13 of 25 positions shown · non-contrast
Comparison: None

CLINICAL DATA: Abortion 4 weeks ago. Vaginal bleeding. Evaluate for
retained products of conception.

EXAM:
TRANSABDOMINAL AND TRANSVAGINAL ULTRASOUND OF PELVIS
TECHNIQUE: Both transabdominal and transvaginal ultrasound examinations of the
pelvis were performed. Transabdominal technique was performed for
global imaging of the pelvis including uterus, ovaries, adnexal
regions, and pelvic cul-de-sac. It was necessary to proceed with
endovaginal exam following the transabdominal exam to visualize the
endometrium and ovaries.

[Series 1: us pelvic complete w transvaginal and torsion righ · 13 of 105 slices shown]
[im 1/105]
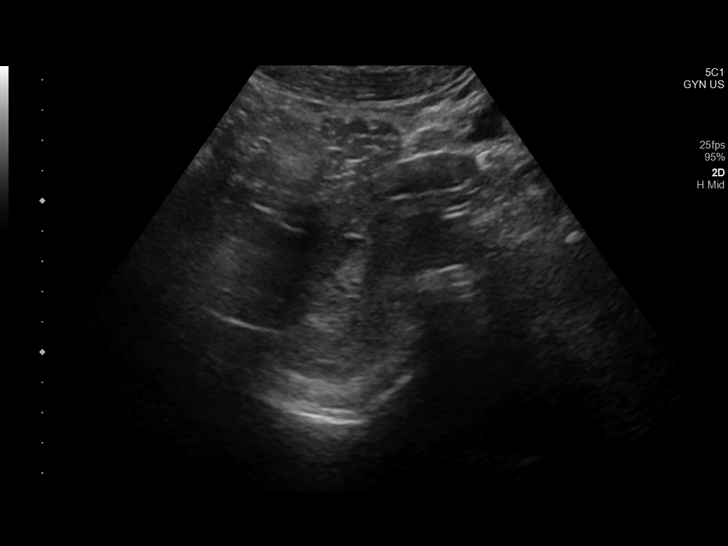
[im 9/105]
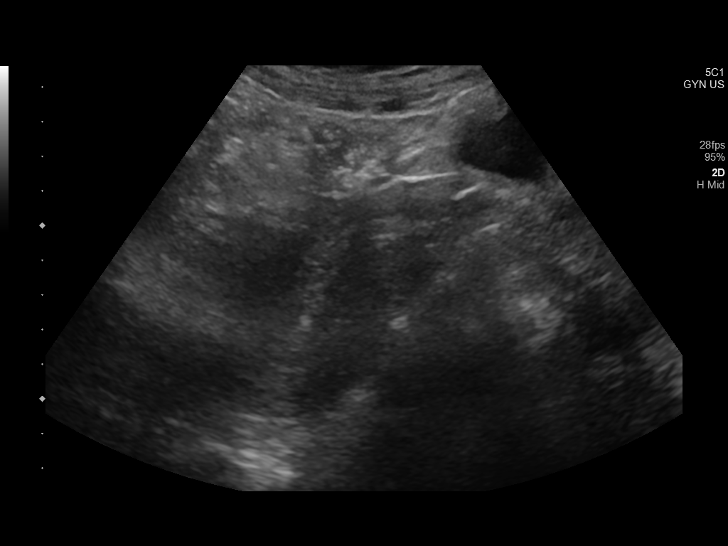
[im 18/105]
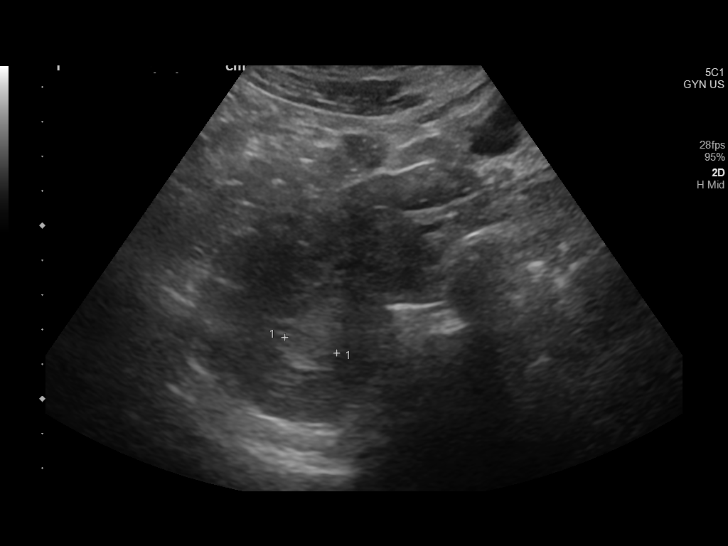
[im 27/105]
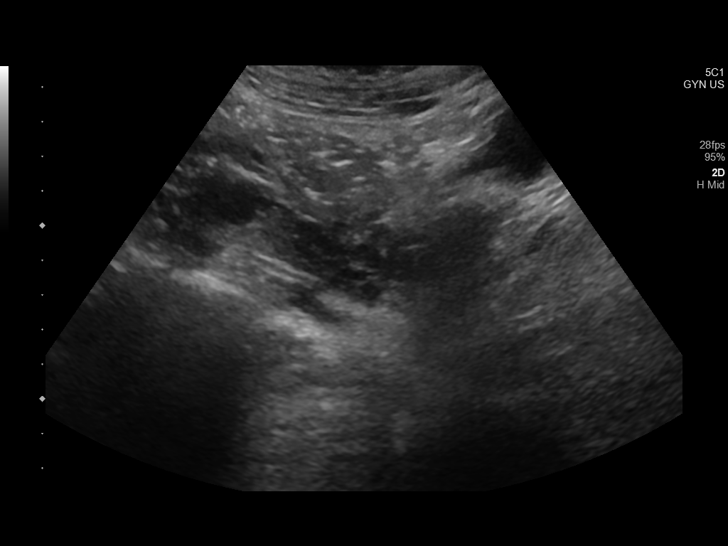
[im 35/105]
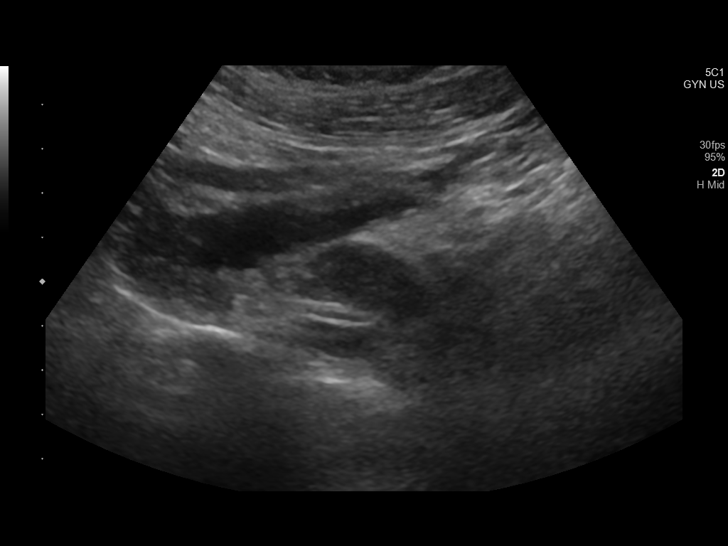
[im 44/105]
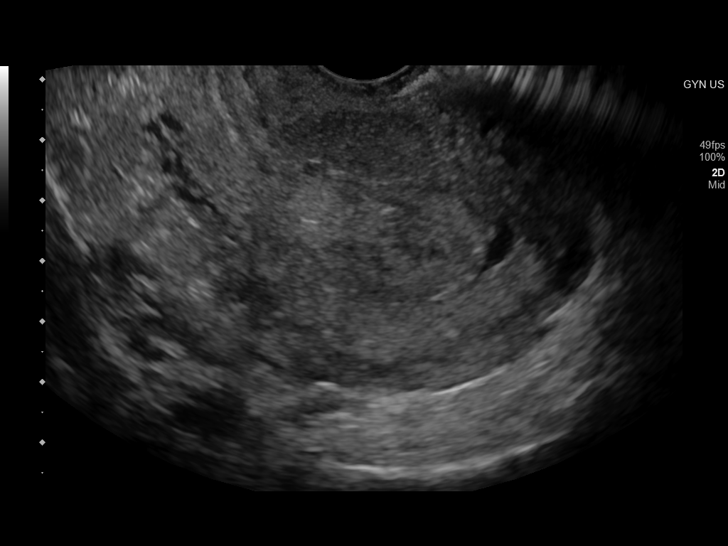
[im 53/105]
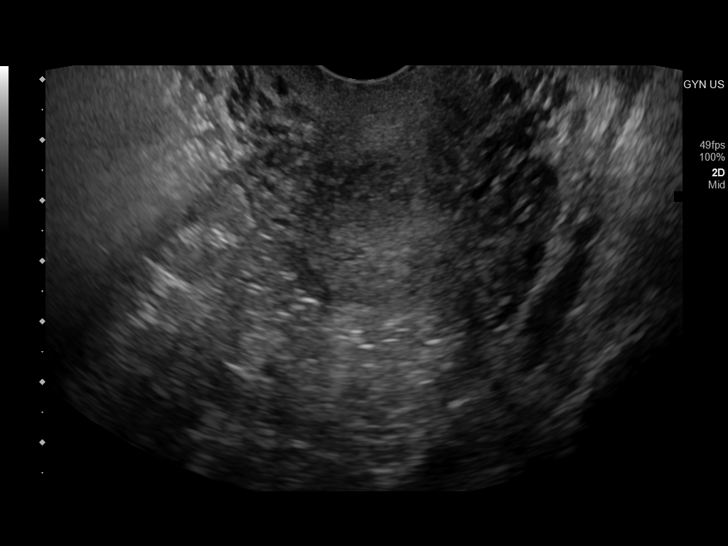
[im 61/105]
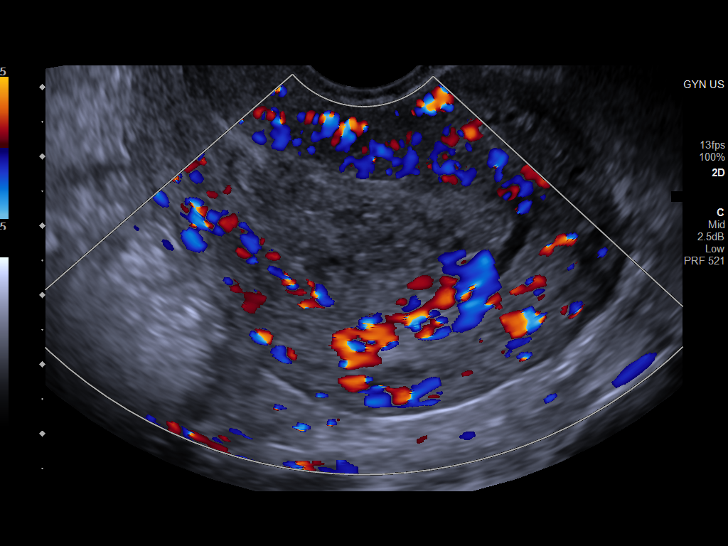
[im 70/105]
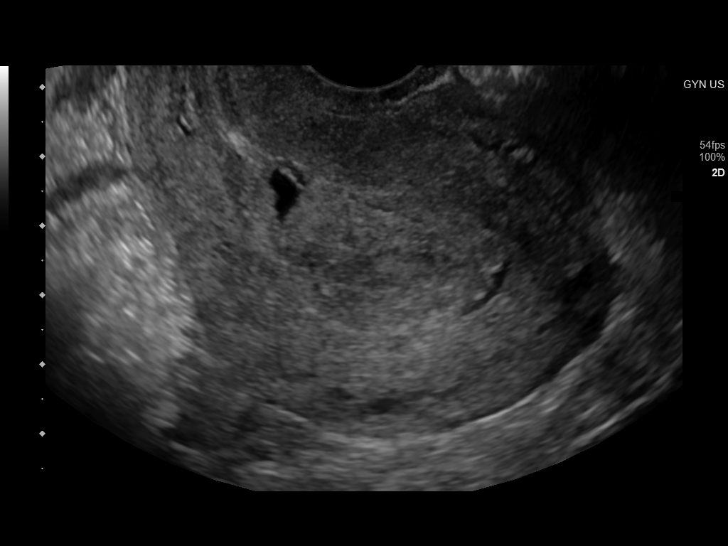
[im 79/105]
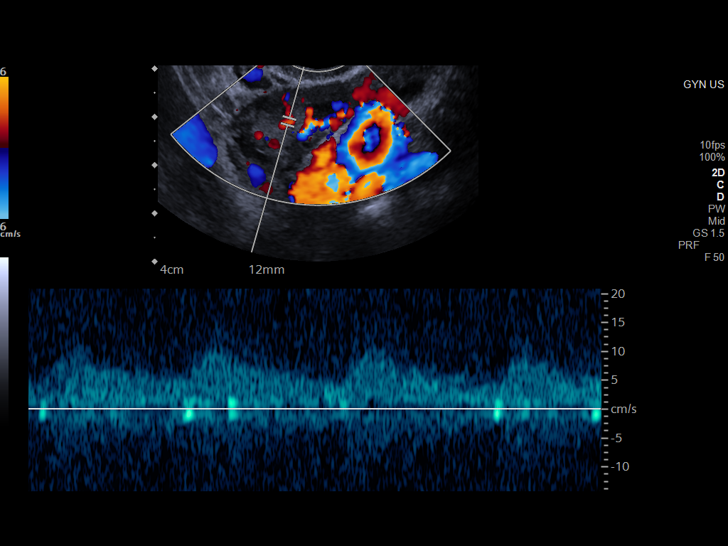
[im 87/105]
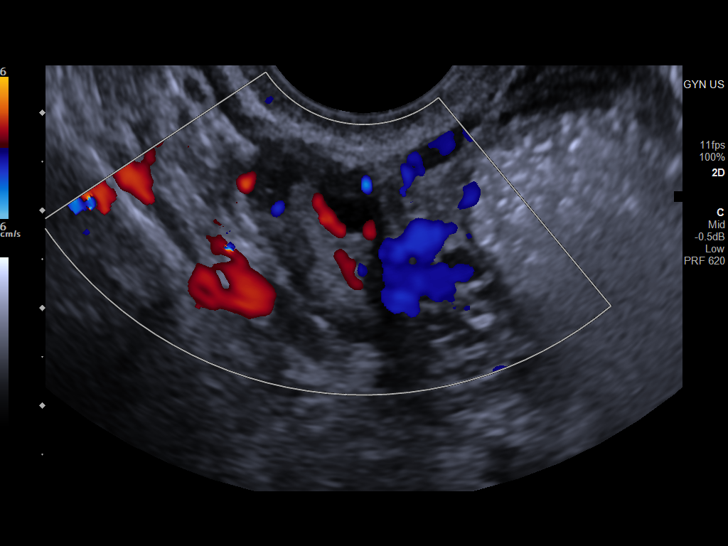
[im 96/105]
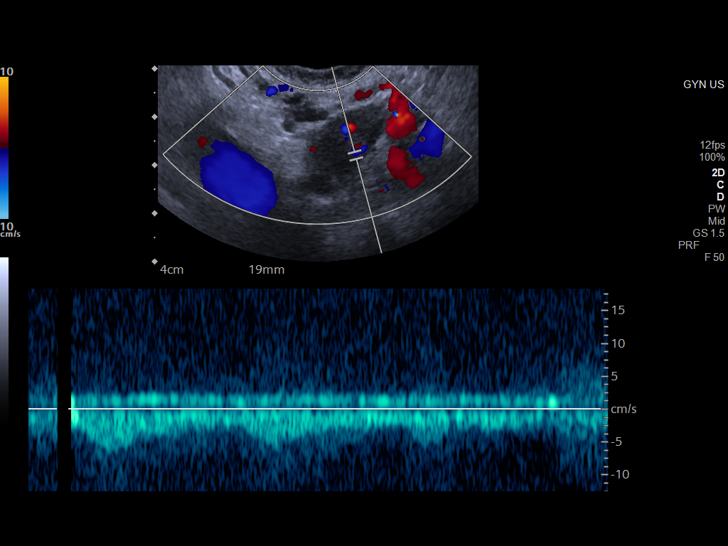
[im 105/105]
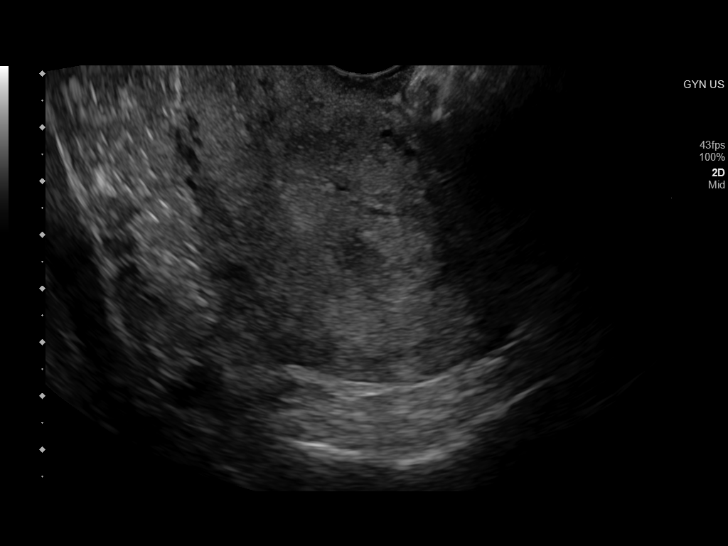

[13 of 25 positions shown; findings below may reference images not displayed]

FINDINGS: Uterus

Measurements: 8.3 x 4.2 x 6.0 cm = volume: 109 mL. No fibroids or
other mass visualized.

Endometrium

Thickness: 21 mm in thickness, heterogeneous. Areas of internal
blood flow. Appearance is concerning for retained products of
conception.

Right ovary

Measurements: 3.1 x 1.5 x 1.1 cm = volume: 2.7 mL. Normal
appearance/no adnexal mass.

Left ovary

Measurements: 3.0 x 1.2 x 0.9 cm = volume: 1.8 mL. Normal
appearance/no adnexal mass.

Other findings

No abnormal free fluid.
IMPRESSION: Thickened, heterogeneous endometrium concerning for retained
products of conception.

## 2022-08-23 MED ORDER — ETONOGESTREL-ETHINYL ESTRADIOL 0.12-0.015 MG/24HR VA RING
VAGINAL_RING | VAGINAL | 3 refills | Status: DC
Start: 2022-08-23 — End: 2023-02-14

## 2022-08-23 MED ORDER — POLYETHYLENE GLYCOL 3350 17 G PO PACK
17.0000 g | PACK | Freq: Every day | ORAL | 11 refills | Status: AC | PRN
Start: 2022-08-23 — End: ?

## 2022-08-23 MED ORDER — DOCUSATE SODIUM 100 MG PO CAPS
100.0000 mg | ORAL_CAPSULE | Freq: Two times a day (BID) | ORAL | 2 refills | Status: AC
Start: 2022-08-23 — End: ?

## 2022-08-23 NOTE — Progress Notes (Signed)
ANNUAL EXAM Patient name: Breanna Shelton MRN [id number]  Date of birth: August 26, 1985 Chief Complaint:   Follow-up and Pelvic Pain (So strong where she could not walk,)  History of Present Illness:   Breanna Shelton is a 37 y.o. [id number] Hispanic female being seen today for a follow up after pelvic pain. She has a history of heavy and painful periods. She uses nuva ring for contraception and menstrual control but ran out of the prescription last month. She took her last ring out the middle of May. Endorses chronic constipation/firm stools. Last BM yesterday. Current complaints: 6/3 seen in ED for 3d of pelvic pain with one day so painful she could not walk.  Patient's last menstrual period was 07/24/2022.   The pregnancy intention screening data noted above was reviewed. Potential methods of contraception were discussed. The patient elected to proceed with Nuva Ring.      Component Value Date/Time   DIAGPAP  02/06/2022 1413    - Negative for intraepithelial lesion or malignancy (NILM)   HPVHIGH Negative 02/06/2022 1413   ADEQPAP  02/06/2022 1413    Satisfactory for evaluation; transformation zone component PRESENT.        07/29/2015    2:28 PM  Depression screen PHQ 2/9  Decreased Interest 0  Down, Depressed, Hopeless 0  PHQ - 2 Score 0         No data to display           Review of Systems:   Pertinent items are noted in HPI Denies any headaches, blurred vision, fatigue, shortness of breath, chest pain, abnormal vaginal discharge/itching/odor/irritation, problems with periods, urination, or intercourse unless otherwise stated above. Pertinent History Reviewed:  Reviewed past medical,surgical, social and family history.  Reviewed problem list, medications and allergies. Physical Assessment:   Vitals:   08/23/22 1043  BP: 111/78  Pulse: 68  Weight: 147 lb (66.7 kg)  Height: 5\' 1"  (1.549 m)  Body mass index is 27.78 kg/m.       Physical  Exam Vitals reviewed.  Constitutional:      Appearance: Normal appearance.  Pulmonary:     Effort: Pulmonary effort is normal.     Breath sounds: Normal breath sounds.  Abdominal:     General: Abdomen is flat.     Palpations: Abdomen is soft.     Tenderness: There is abdominal tenderness in the right lower quadrant and left lower quadrant. There is no guarding or rebound.  Genitourinary:    General: Normal vulva.     Vagina: Normal.     Cervix: No cervical motion tenderness.     Uterus: Normal.      Adnexa:        Right: No mass or tenderness.         Left: No mass or tenderness.    Musculoskeletal:     Cervical back: Normal range of motion and neck supple. No tenderness.  Skin:    General: Skin is warm and dry.  Neurological:     General: No focal deficit present.     Mental Status: She is alert and oriented to person, place, and time.  Psychiatric:        Mood and Affect: Mood normal.        Behavior: Behavior normal.      No results found for this or any previous visit (from the past 24 hour(s)).  Assessment & Plan:  1) Dysmenorrhea: pelvic pain occurred during menstrual cycle, this  was the first cycle off Nuva Ring. Pain was likely due to cessation of hormones. Options for menstrual control reviewed & she would like to continue [employer]. Restart after next cycle.  2) Contraceptive surveillance, Nuva Ring  3) Constipation: start daily colace, may use miralax prn, discontinue or decrease use with loose stools.    Mammogram: @ 35yo, or sooner if problems Colonoscopy: @ 35yo, or sooner if problems  Orders Placed This Encounter  Procedures   Basic Metabolic Panel (7)   CBC With Differential   Hemoglobin A1c   TSH   VITAMIN D 25 Hydroxy (Vit-D Deficiency, Fractures)   Lipid Profile    Meds:  Meds ordered this encounter  Medications   docusate sodium (COLACE) 100 MG capsule    Sig: Take 1 capsule (100 mg total) by mouth 2 (two) times daily. Decrease to once  daily for loose stools.    Dispense:  30 capsule    Refill:  2    Order Specific Question:   Supervising Provider    Answer:   Hildred Laser [[id number]]   polyethylene glycol (MIRALAX) 17 g packet    Sig: Take 17 g by mouth daily as needed.    Dispense:  30 each    Refill:  11    Order Specific Question:   Supervising Provider    Answer:   Hildred Laser [[id number]]   etonogestrel-ethinyl estradiol (NUVARING) 0.12-0.015 MG/24HR vaginal ring    Sig: Insert vaginally and leave in place for 3 consecutive weeks, then remove for 1 week.    Dispense:  9 each    Refill:  3    Order Specific Question:   Supervising Provider    Answer:   Hildred Laser [[id number]]    Follow-up: No follow-ups on file.  Dominica Severin, CNM 08/23/2022 11:54 AM

## 2022-08-24 LAB — CBC WITH DIFFERENTIAL
Basophils Absolute: 0 10*3/uL (ref 0.0–0.2)
Basos: 1 %
EOS (ABSOLUTE): 0.1 10*3/uL (ref 0.0–0.4)
Eos: 1 %
Hematocrit: 40.8 % (ref 34.0–46.6)
Hemoglobin: 13.1 g/dL (ref 11.1–15.9)
Immature Grans (Abs): 0 10*3/uL (ref 0.0–0.1)
Immature Granulocytes: 0 %
Lymphocytes Absolute: 2.2 10*3/uL (ref 0.7–3.1)
Lymphs: 48 %
MCH: 30 pg (ref 26.6–33.0)
MCHC: 32.1 g/dL (ref 31.5–35.7)
MCV: 93 fL (ref 79–97)
Monocytes Absolute: 0.3 10*3/uL (ref 0.1–0.9)
Monocytes: 7 %
Neutrophils Absolute: 2 10*3/uL (ref 1.4–7.0)
Neutrophils: 43 %
RBC: 4.37 x10E6/uL (ref 3.77–5.28)
RDW: 12.4 % (ref 11.7–15.4)
WBC: 4.6 10*3/uL (ref 3.4–10.8)

## 2022-08-24 LAB — BASIC METABOLIC PANEL (7)
BUN/Creatinine Ratio: 19 (ref 9–23)
BUN: 15 mg/dL (ref 6–20)
CO2: 23 mmol/L (ref 20–29)
Chloride: 103 mmol/L (ref 96–106)
Creatinine, Ser: 0.78 mg/dL (ref 0.57–1.00)
Glucose: 80 mg/dL (ref 70–99)
Potassium: 4.3 mmol/L (ref 3.5–5.2)
Sodium: 138 mmol/L (ref 134–144)
eGFR: 102 mL/min/{1.73_m2} (ref >59)

## 2022-08-24 LAB — HEMOGLOBIN A1C
Est. average glucose Bld gHb Est-mCnc: 105 mg/dL
Hgb A1c MFr Bld: 5.3 % (ref 4.8–5.6)

## 2022-08-24 LAB — LIPID PANEL
Chol/HDL Ratio: 2.9 ratio (ref 0.0–4.4)
Cholesterol, Total: 154 mg/dL (ref 100–199)
HDL: 54 mg/dL (ref >39)
LDL Chol Calc (NIH): 86 mg/dL (ref 0–99)
Triglycerides: 74 mg/dL (ref 0–149)
VLDL Cholesterol Cal: 14 mg/dL (ref 5–40)

## 2022-08-24 LAB — TSH: TSH: 3.56 u[IU]/mL (ref 0.450–4.500)

## 2022-08-24 LAB — VITAMIN D 25 HYDROXY (VIT D DEFICIENCY, FRACTURES): Vit D, 25-Hydroxy: 34.2 ng/mL (ref 30.0–100.0)

## 2022-10-01 NOTE — Telephone Encounter (Signed)
Pt saw Hartley Barefoot on 08/23/2022.

## 2022-11-16 ENCOUNTER — Other Ambulatory Visit: Payer: Self-pay

## 2022-11-16 ENCOUNTER — Encounter: Payer: Self-pay | Admitting: Emergency Medicine

## 2022-11-16 ENCOUNTER — Emergency Department: Payer: 59

## 2022-11-16 ENCOUNTER — Emergency Department
Admission: EM | Admit: 2022-11-16 | Discharge: 2022-11-16 | Disposition: A | Discharge status: Home / Self Care | Payer: 59 | Attending: Emergency Medicine | Admitting: Emergency Medicine

## 2022-11-16 DIAGNOSIS — Y92410 Unspecified street and highway as the place of occurrence of the external cause: Secondary | ICD-10-CM | POA: Diagnosis not present

## 2022-11-16 DIAGNOSIS — M25551 Pain in right hip: Secondary | ICD-10-CM | POA: Insufficient documentation

## 2022-11-16 DIAGNOSIS — R519 Headache, unspecified: Secondary | ICD-10-CM | POA: Insufficient documentation

## 2022-11-16 DIAGNOSIS — M542 Cervicalgia: Secondary | ICD-10-CM | POA: Insufficient documentation

## 2022-11-16 DIAGNOSIS — R0789 Other chest pain: Secondary | ICD-10-CM | POA: Diagnosis not present

## 2022-11-16 LAB — POC URINE PREG, ED: Preg Test, Ur: NEGATIVE

## 2022-11-16 MED ORDER — ACETAMINOPHEN 500 MG PO TABS
1000.0000 mg | ORAL_TABLET | Freq: Once | ORAL | Status: AC
  Administered 2022-11-16: 1000 mg via ORAL
  Filled 2022-11-16: qty 2

## 2022-11-16 MED ORDER — CYCLOBENZAPRINE HCL 5 MG PO TABS: 5.0000 mg | ORAL_TABLET | Freq: Three times a day (TID) | ORAL | 0 refills | Status: AC | PRN
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
## 2023-02-14 ENCOUNTER — Other Ambulatory Visit: Payer: Self-pay | Admitting: Certified Nurse Midwife

## 2023-02-14 DIAGNOSIS — Z01411 Encounter for gynecological examination (general) (routine) with abnormal findings: Secondary | ICD-10-CM

## 2023-02-14 DIAGNOSIS — Z30018 Encounter for initial prescription of other contraceptives: Secondary | ICD-10-CM

## 2023-03-16 ENCOUNTER — Other Ambulatory Visit: Payer: Self-pay | Admitting: Advanced Practice Midwife

## 2023-03-16 DIAGNOSIS — Z30018 Encounter for initial prescription of other contraceptives: Secondary | ICD-10-CM

## 2023-03-16 DIAGNOSIS — Z01411 Encounter for gynecological examination (general) (routine) with abnormal findings: Secondary | ICD-10-CM

## 2023-04-09 ENCOUNTER — Ambulatory Visit: Payer: 59 | Admitting: Certified Nurse Midwife

## 2023-06-07 ENCOUNTER — Other Ambulatory Visit: Payer: Self-pay

## 2023-06-07 ENCOUNTER — Emergency Department
Admission: EM | Admit: 2023-06-07 | Discharge: 2023-06-07 | Disposition: A | Discharge status: Home / Self Care | Attending: Student in an Organized Health Care Education/Training Program | Admitting: Student in an Organized Health Care Education/Training Program

## 2023-06-07 DIAGNOSIS — S31801A Laceration without foreign body of unspecified buttock, initial encounter: Secondary | ICD-10-CM | POA: Insufficient documentation

## 2023-06-07 DIAGNOSIS — W540XXA Bitten by dog, initial encounter: Secondary | ICD-10-CM | POA: Insufficient documentation

## 2023-06-07 DIAGNOSIS — S3992XA Unspecified injury of lower back, initial encounter: Secondary | ICD-10-CM | POA: Diagnosis present

## 2023-06-07 MED ORDER — AMOXICILLIN-POT CLAVULANATE 875-125 MG PO TABS
1.0000 | ORAL_TABLET | Freq: Once | ORAL | Status: AC
  Administered 2023-06-07: 1 via ORAL
  Filled 2023-06-07: qty 1

## 2023-06-07 MED ORDER — IBUPROFEN 800 MG PO TABS
800.0000 mg | ORAL_TABLET | Freq: Once | ORAL | Status: AC
  Administered 2023-06-07: 800 mg via ORAL
  Filled 2023-06-07: qty 1

## 2023-06-07 MED ORDER — AMOXICILLIN-POT CLAVULANATE 875-125 MG PO TABS: 1.0000 | ORAL_TABLET | Freq: Two times a day (BID) | ORAL | 0 refills | Status: AC
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
## 2023-06-07 NOTE — Discharge Instructions (Addendum)
 Please take antibiotics as prescribed.  You may apply a thin layer of antibiotic ointment once daily to the dog bite site.  Apply ice 20 minutes every hour.  Return to the ER for any increasing pain, warmth redness or fevers.  Animal control or strips department should be in contact with you soon to discuss vaccine status of the dog as well as possible quarantine if dog's vaccines are not up-to-date.  Please contact animal control or service department if they have not contacted you within 24 hours.

## 2023-06-07 NOTE — ED Triage Notes (Signed)
 Pt reports she was walking down the street and she got bit by a dog. Pt has bite mark to right buttocks, pt doesn't know what kind of dog it was. Dog came from [address] [place] [address], bite has not been reported.

## 2023-06-07 NOTE — ED Provider Notes (Signed)
 [place] [facility] AT [facility] Provider Note   CSN: [id number] Arrival date & time: 06/07/23  2050     History  Chief Complaint  Patient presents with   Animal Bite    Breanna Shelton is a 38 y.o. female presents to the emergency department for evaluation of dog bite.  Patient was walking with her daughter in front of the house, the neighbors dog across the street ran over to her and bit her in the right gluteal region.  She suffered a small 2 cm skin abrasion.  No puncture wound.  She has minimal swelling and remains ambulatory with no assistive devices.  Patient's tetanus is up-to-date.  Patient lives in [place], states [place] out [place] the [place].  Animal control/Sheriff's department to be notified.  Patient describes the dog is a large black dog, unknown breed.  Dog quickly responded to the neighbor across the street after he was called home and ran home immediately after attacking the patient.  HPI     Home Medications Prior to Admission medications   Medication Sig Start Date End Date Taking? Authorizing Provider  amoxicillin-clavulanate (AUGMENTIN) 875-125 MG tablet Take 1 tablet by mouth 2 (two) times daily for 7 days. 06/07/23 06/14/23 Yes Evon Slack, PA-C  cyclobenzaprine (FLEXERIL) 5 MG tablet Take 1 tablet (5 mg total) by mouth 3 (three) times daily as needed for muscle spasms. 11/16/22   Minna Antis, MD  docusate sodium (COLACE) 100 MG capsule Take 1 capsule (100 mg total) by mouth 2 (two) times daily. Decrease to once daily for loose stools. 08/23/22   Dominica Severin, CNM  HALOETTE 0.12-0.015 MG/24HR vaginal ring INSERT 1 RING VAGINALLY FOR 3 CONSECUTIVE WEEKS THEN REMOVE FOR 1 WEEK 02/14/23   Dominica Severin, CNM  polyethylene glycol (MIRALAX) 17 g packet Take 17 g by mouth daily as needed. 08/23/22   Dominica Severin, CNM      Allergies    Patient has no known allergies.    Review of Systems   Review of Systems  Physical  Exam Updated Vital Signs BP 129/79   Pulse 71   Temp 98.6 F (37 C)   Resp 18   Ht 5\' 2"  (1.575 m)   Wt 64.9 kg   SpO2 100%   BMI 26.16 kg/m  Physical Exam Constitutional:      Appearance: She is well-developed.  HENT:     Head: Normocephalic and atraumatic.  Eyes:     Conjunctiva/sclera: Conjunctivae normal.  Cardiovascular:     Rate and Rhythm: Normal rate.  Pulmonary:     Effort: Pulmonary effort is normal. No respiratory distress.  Musculoskeletal:        General: Normal range of motion.     Cervical back: Normal range of motion.     Comments: Right mid gluteal region with a 2 cm skin tear/abrasion from dog bite.  No puncture wounds.  No swelling or ecchymosis.  No active bleeding.  Wound cleansed with alcohol.  Patient nontender throughout the hamstring has no painful resisted hamstring flexion.  Full active extension of the right lower extremity.  No compartmental swelling throughout the right gluteal region/thigh.  Skin:    General: Skin is warm.     Findings: No rash.  Neurological:     General: No focal deficit present.     Mental Status: She is alert and oriented to person, place, and time.  Psychiatric:        Behavior:  Behavior normal.        Thought Content: Thought content normal.     ED Results / Procedures / Treatments   Labs (all labs ordered are listed, but only abnormal results are displayed) Labs Reviewed - No data to display  EKG None  Radiology No results found.  Procedures Procedures    Medications Ordered in ED Medications  amoxicillin-clavulanate (AUGMENTIN) 875-125 MG per tablet 1 tablet (1 tablet Oral Given 06/07/23 2235)  ibuprofen (ADVIL) tablet 800 mg (800 mg Oral Given 06/07/23 2235)    ED Course/ Medical Decision Making/ A&P                                 Medical Decision Making Risk Prescription drug management.   38 year old female with dog bite right gluteal region.  Small superficial 2 cm abrasion.  Wound was  cleansed with alcohol.  No active bleeding.  Patient placed on prophylactic antibiotics.  Her tetanus is up-to-date.  Animal control has been notified.  Patient understands signs and symptoms return to the ED for. Final Clinical Impression(s) / ED Diagnoses Final diagnoses:  Dog bite, initial encounter    Rx / DC Orders ED Discharge Orders          Ordered    amoxicillin-clavulanate (AUGMENTIN) 875-125 MG tablet  2 times daily        06/07/23 2238              Ronnette Juniper 06/07/23 2246    Willy Eddy, MD 06/07/23 [phone]
